# Patient Record
Sex: Female | Born: 1975 | Race: White | Hispanic: No | Marital: Single | Smoking: Former smoker
Health system: Southern US, Community
[De-identification: ages and names within clinical notes are randomized; demographics above are authoritative.]

## PROBLEM LIST (undated history)

## (undated) DIAGNOSIS — I10 Essential (primary) hypertension: Secondary | ICD-10-CM

## (undated) DIAGNOSIS — I219 Acute myocardial infarction, unspecified: Secondary | ICD-10-CM

## (undated) DIAGNOSIS — J309 Allergic rhinitis, unspecified: Secondary | ICD-10-CM

## (undated) HISTORY — DX: Acute myocardial infarction, unspecified: I21.9

## (undated) HISTORY — DX: Allergic rhinitis, unspecified: J30.9

## (undated) HISTORY — PX: OTHER SURGICAL HISTORY: SHX169

---

## 1998-07-22 ENCOUNTER — Encounter: Payer: Self-pay | Admitting: Emergency Medicine

## 1998-07-22 ENCOUNTER — Emergency Department (HOSPITAL_COMMUNITY): Admission: EM | Admit: 1998-07-22 | Discharge: 1998-07-22 | Payer: Self-pay | Admitting: Emergency Medicine

## 2001-01-04 ENCOUNTER — Other Ambulatory Visit: Admission: RE | Admit: 2001-01-04 | Discharge: 2001-01-04 | Payer: Self-pay | Admitting: Family Medicine

## 2001-12-25 ENCOUNTER — Other Ambulatory Visit: Admission: RE | Admit: 2001-12-25 | Discharge: 2001-12-25 | Payer: Self-pay | Admitting: Family Medicine

## 2003-01-03 ENCOUNTER — Other Ambulatory Visit: Admission: RE | Admit: 2003-01-03 | Discharge: 2003-01-03 | Payer: Self-pay | Admitting: Gynecology

## 2005-01-05 ENCOUNTER — Other Ambulatory Visit: Admission: RE | Admit: 2005-01-05 | Discharge: 2005-01-05 | Payer: Self-pay | Admitting: Gynecology

## 2005-05-17 ENCOUNTER — Other Ambulatory Visit: Admission: RE | Admit: 2005-05-17 | Discharge: 2005-05-17 | Payer: Self-pay | Admitting: Gynecology

## 2005-11-16 ENCOUNTER — Other Ambulatory Visit: Admission: RE | Admit: 2005-11-16 | Discharge: 2005-11-16 | Payer: Self-pay | Admitting: Gynecology

## 2006-02-09 ENCOUNTER — Other Ambulatory Visit: Admission: RE | Admit: 2006-02-09 | Discharge: 2006-02-09 | Payer: Self-pay | Admitting: Gynecology

## 2006-02-13 ENCOUNTER — Encounter: Admission: RE | Admit: 2006-02-13 | Discharge: 2006-02-13 | Payer: Self-pay | Admitting: Gynecology

## 2007-05-09 ENCOUNTER — Inpatient Hospital Stay: Payer: Self-pay | Admitting: Unknown Physician Specialty

## 2012-10-11 ENCOUNTER — Ambulatory Visit: Payer: Self-pay | Admitting: Family Medicine

## 2015-03-16 ENCOUNTER — Encounter: Payer: Self-pay | Admitting: Emergency Medicine

## 2015-03-16 DIAGNOSIS — I214 Non-ST elevation (NSTEMI) myocardial infarction: Principal | ICD-10-CM | POA: Diagnosis present

## 2015-03-16 DIAGNOSIS — I10 Essential (primary) hypertension: Secondary | ICD-10-CM | POA: Diagnosis present

## 2015-03-16 DIAGNOSIS — F329 Major depressive disorder, single episode, unspecified: Secondary | ICD-10-CM | POA: Diagnosis present

## 2015-03-16 DIAGNOSIS — Z8249 Family history of ischemic heart disease and other diseases of the circulatory system: Secondary | ICD-10-CM

## 2015-03-16 DIAGNOSIS — Z6841 Body Mass Index (BMI) 40.0 and over, adult: Secondary | ICD-10-CM

## 2015-03-16 DIAGNOSIS — Z79899 Other long term (current) drug therapy: Secondary | ICD-10-CM

## 2015-03-16 NOTE — ED Notes (Signed)
Patient ambulatory to triage with steady gait, without difficulty or distress noted; pt reports began new job 2wks ago "it's really physical and I'm really sore in my chest and arms but it's not going away"; describes as squeezing to chest and arms bilat intermittently for last few days with no accomp symptoms; denies hx of same

## 2015-03-17 ENCOUNTER — Inpatient Hospital Stay
Admission: EM | Admit: 2015-03-17 | Discharge: 2015-03-19 | DRG: 281 | Disposition: A | Discharge status: Home / Self Care | Payer: BC Managed Care – PPO | Attending: Specialist | Admitting: Specialist

## 2015-03-17 ENCOUNTER — Encounter
Admission: EM | Disposition: A | Discharge status: Home / Self Care | Payer: Self-pay | Source: Home / Self Care | Attending: Specialist

## 2015-03-17 ENCOUNTER — Encounter: Payer: Self-pay | Admitting: Internal Medicine

## 2015-03-17 ENCOUNTER — Emergency Department: Payer: BC Managed Care – PPO

## 2015-03-17 ENCOUNTER — Inpatient Hospital Stay
Admit: 2015-03-17 | Discharge: 2015-03-17 | Disposition: A | Discharge status: Home / Self Care | Payer: BC Managed Care – PPO | Attending: Internal Medicine | Admitting: Internal Medicine

## 2015-03-17 DIAGNOSIS — I10 Essential (primary) hypertension: Secondary | ICD-10-CM | POA: Diagnosis present

## 2015-03-17 DIAGNOSIS — Z79899 Other long term (current) drug therapy: Secondary | ICD-10-CM | POA: Diagnosis not present

## 2015-03-17 DIAGNOSIS — I209 Angina pectoris, unspecified: Secondary | ICD-10-CM

## 2015-03-17 DIAGNOSIS — R079 Chest pain, unspecified: Secondary | ICD-10-CM | POA: Diagnosis present

## 2015-03-17 DIAGNOSIS — I214 Non-ST elevation (NSTEMI) myocardial infarction: Secondary | ICD-10-CM | POA: Diagnosis present

## 2015-03-17 DIAGNOSIS — F329 Major depressive disorder, single episode, unspecified: Secondary | ICD-10-CM | POA: Diagnosis present

## 2015-03-17 DIAGNOSIS — Z8249 Family history of ischemic heart disease and other diseases of the circulatory system: Secondary | ICD-10-CM | POA: Diagnosis not present

## 2015-03-17 DIAGNOSIS — Z6841 Body Mass Index (BMI) 40.0 and over, adult: Secondary | ICD-10-CM | POA: Diagnosis not present

## 2015-03-17 HISTORY — DX: Essential (primary) hypertension: I10

## 2015-03-17 HISTORY — PX: CARDIAC CATHETERIZATION: SHX172

## 2015-03-17 LAB — LIPID PANEL
Cholesterol: 261 mg/dL — ABNORMAL HIGH (ref 0–200)
HDL: 31 mg/dL — ABNORMAL LOW (ref >40)
LDL Cholesterol: 162 mg/dL — ABNORMAL HIGH (ref 0–99)
Total CHOL/HDL Ratio: 8.4 RATIO
Triglycerides: 339 mg/dL — ABNORMAL HIGH (ref <150)
VLDL: 68 mg/dL — ABNORMAL HIGH (ref 0–40)

## 2015-03-17 LAB — PROTIME-INR
INR: 0.97
Prothrombin Time: 13.1 seconds (ref 11.4–15.0)

## 2015-03-17 LAB — CBC
HEMATOCRIT: 40.5 % (ref 35.0–47.0)
Hemoglobin: 13.5 g/dL (ref 12.0–16.0)
MCH: 29.2 pg (ref 26.0–34.0)
MCHC: 33.3 g/dL (ref 32.0–36.0)
MCV: 87.7 fL (ref 80.0–100.0)
Platelets: 157 10*3/uL (ref 150–440)
RBC: 4.62 MIL/uL (ref 3.80–5.20)
RDW: 13.7 % (ref 11.5–14.5)
WBC: 11.2 10*3/uL — ABNORMAL HIGH (ref 3.6–11.0)

## 2015-03-17 LAB — URINE DRUG SCREEN, QUALITATIVE (ARMC ONLY)
Amphetamines, Ur Screen: NOT DETECTED
Barbiturates, Ur Screen: NOT DETECTED
Benzodiazepine, Ur Scrn: NOT DETECTED
CANNABINOID 50 NG, UR ~~LOC~~: NOT DETECTED
COCAINE METABOLITE, UR ~~LOC~~: NOT DETECTED
MDMA (ECSTASY) UR SCREEN: NOT DETECTED
METHADONE SCREEN, URINE: NOT DETECTED
Opiate, Ur Screen: NOT DETECTED
Phencyclidine (PCP) Ur S: NOT DETECTED
TRICYCLIC, UR SCREEN: NOT DETECTED

## 2015-03-17 LAB — APTT: APTT: 41 s — AB (ref 24–36)

## 2015-03-17 LAB — BASIC METABOLIC PANEL
Anion gap: 5 (ref 5–15)
BUN: 14 mg/dL (ref 6–20)
CO2: 26 mmol/L (ref 22–32)
CREATININE: 0.87 mg/dL (ref 0.44–1.00)
Calcium: 8.7 mg/dL — ABNORMAL LOW (ref 8.9–10.3)
Chloride: 107 mmol/L (ref 101–111)
GFR calc Af Amer: >60 mL/min (ref >60)
GLUCOSE: 116 mg/dL — AB (ref 65–99)
POTASSIUM: 3.5 mmol/L (ref 3.5–5.1)
Sodium: 138 mmol/L (ref 135–145)

## 2015-03-17 LAB — TROPONIN I
Troponin I: 1.17 ng/mL — ABNORMAL HIGH (ref <0.031)
Troponin I: 1.26 ng/mL — ABNORMAL HIGH (ref <0.031)
Troponin I: 1.79 ng/mL — ABNORMAL HIGH (ref <0.031)

## 2015-03-17 LAB — TSH: TSH: 6.05 u[IU]/mL — ABNORMAL HIGH (ref 0.350–4.500)

## 2015-03-17 LAB — HEPARIN LEVEL (UNFRACTIONATED): HEPARIN UNFRACTIONATED: 0.1 [IU]/mL — AB (ref 0.30–0.70)

## 2015-03-17 LAB — HEMOGLOBIN A1C: Hgb A1c MFr Bld: 5.3 % (ref 4.0–6.0)

## 2015-03-17 SURGERY — LEFT HEART CATH AND CORONARY ANGIOGRAPHY
Anesthesia: Moderate Sedation | Laterality: Right

## 2015-03-17 MED ORDER — ASPIRIN 81 MG PO CHEW
CHEWABLE_TABLET | ORAL | Status: AC
Start: 2015-03-17 — End: 2015-03-17
  Administered 2015-03-17: 12:00:00
  Filled 2015-03-17: qty 1

## 2015-03-17 MED ORDER — ASPIRIN 81 MG PO CHEW
324.0000 mg | CHEWABLE_TABLET | Freq: Once | ORAL | Status: AC
  Administered 2015-03-17: 324 mg via ORAL
  Filled 2015-03-17: qty 4

## 2015-03-17 MED ORDER — LABETALOL HCL 5 MG/ML IV SOLN
10.0000 mg | INTRAVENOUS | Status: DC | PRN
Start: 2015-03-17 — End: 2015-03-19
  Administered 2015-03-17: 10 mg via INTRAVENOUS

## 2015-03-17 MED ORDER — IOHEXOL 300 MG/ML  SOLN
INTRAMUSCULAR | Status: DC | PRN
  Administered 2015-03-17: 95 mL via INTRA_ARTERIAL
  Administered 2015-03-17: 30 mL via INTRA_ARTERIAL

## 2015-03-17 MED ORDER — SODIUM CHLORIDE 0.9 % WEIGHT BASED INFUSION: 1.0000 mL/kg/h | INTRAVENOUS | Status: DC

## 2015-03-17 MED ORDER — SODIUM CHLORIDE 0.9 % IJ SOLN
3.0000 mL | INTRAMUSCULAR | Status: DC | PRN
Start: 2015-03-17 — End: 2015-03-18

## 2015-03-17 MED ORDER — ASPIRIN 81 MG PO CHEW
81.0000 mg | CHEWABLE_TABLET | ORAL | Status: AC
  Administered 2015-03-18: 81 mg via ORAL
  Filled 2015-03-17: qty 1

## 2015-03-17 MED ORDER — SODIUM CHLORIDE 0.9 % WEIGHT BASED INFUSION: 3.0000 mL/kg/h | INTRAVENOUS | Status: DC

## 2015-03-17 MED ORDER — SODIUM CHLORIDE 0.9 % IV SOLN: 250.0000 mL | INTRAVENOUS | Status: DC | PRN

## 2015-03-17 MED ORDER — DOCUSATE SODIUM 100 MG PO CAPS
100.0000 mg | ORAL_CAPSULE | Freq: Two times a day (BID) | ORAL | Status: DC
  Administered 2015-03-19: 100 mg via ORAL
  Filled 2015-03-17 (×4): qty 1

## 2015-03-17 MED ORDER — FENTANYL CITRATE (PF) 100 MCG/2ML IJ SOLN
INTRAMUSCULAR | Status: AC
  Filled 2015-03-17: qty 2

## 2015-03-17 MED ORDER — SODIUM CHLORIDE 0.9 % IJ SOLN
3.0000 mL | Freq: Two times a day (BID) | INTRAMUSCULAR | Status: DC
  Administered 2015-03-17: 3 mL via INTRAVENOUS

## 2015-03-17 MED ORDER — MORPHINE SULFATE (PF) 2 MG/ML IV SOLN: 2.0000 mg | INTRAVENOUS | Status: DC | PRN

## 2015-03-17 MED ORDER — HEPARIN (PORCINE) IN NACL 100-0.45 UNIT/ML-% IJ SOLN
1500.0000 [IU]/h | INTRAMUSCULAR | Status: DC
  Administered 2015-03-17 (×2): 1200 [IU]/h via INTRAVENOUS
  Filled 2015-03-17 (×2): qty 250

## 2015-03-17 MED ORDER — ACETAMINOPHEN 325 MG PO TABS
650.0000 mg | ORAL_TABLET | ORAL | Status: DC | PRN
  Filled 2015-03-17 (×2): qty 2

## 2015-03-17 MED ORDER — SODIUM CHLORIDE 0.9 % IJ SOLN: 3.0000 mL | INTRAMUSCULAR | Status: DC | PRN

## 2015-03-17 MED ORDER — TIROFIBAN (AGGRASTAT) BOLUS VIA INFUSION
25.0000 ug/kg | Freq: Once | INTRAVENOUS | Status: AC
  Administered 2015-03-17: 3025 ug via INTRAVENOUS
  Filled 2015-03-17: qty 61

## 2015-03-17 MED ORDER — SODIUM CHLORIDE 0.9 % IJ SOLN
3.0000 mL | Freq: Two times a day (BID) | INTRAMUSCULAR | Status: DC
  Administered 2015-03-17 – 2015-03-18 (×2): 3 mL via INTRAVENOUS

## 2015-03-17 MED ORDER — ONDANSETRON HCL 4 MG/2ML IJ SOLN
INTRAMUSCULAR | Status: AC
  Administered 2015-03-17: 4 mg via INTRAVENOUS
  Filled 2015-03-17: qty 2

## 2015-03-17 MED ORDER — ACETAMINOPHEN 650 MG RE SUPP: 650.0000 mg | Freq: Four times a day (QID) | RECTAL | Status: DC | PRN

## 2015-03-17 MED ORDER — TIROFIBAN HCL IV 5 MG/100ML: 0.1500 ug/kg/min | INTRAVENOUS | Status: DC

## 2015-03-17 MED ORDER — ASPIRIN 81 MG PO CHEW: 81.0000 mg | CHEWABLE_TABLET | ORAL | Status: DC

## 2015-03-17 MED ORDER — TIROFIBAN HCL IV 5 MG/100ML
0.1500 ug/kg/min | INTRAVENOUS | Status: AC
  Administered 2015-03-17 – 2015-03-18 (×5): 0.15 ug/kg/min via INTRAVENOUS
  Filled 2015-03-17 (×8): qty 100

## 2015-03-17 MED ORDER — NITROGLYCERIN 0.4 MG SL SUBL
0.4000 mg | SUBLINGUAL_TABLET | SUBLINGUAL | Status: DC | PRN
  Administered 2015-03-17 (×4): 0.4 mg via SUBLINGUAL
  Filled 2015-03-17 (×4): qty 1

## 2015-03-17 MED ORDER — LABETALOL HCL 5 MG/ML IV SOLN
INTRAVENOUS | Status: AC
  Administered 2015-03-17: 10 mg via INTRAVENOUS
  Filled 2015-03-17: qty 4

## 2015-03-17 MED ORDER — HEPARIN SODIUM (PORCINE) 5000 UNIT/ML IJ SOLN: 5000.0000 [IU] | Freq: Three times a day (TID) | INTRAMUSCULAR | Status: DC

## 2015-03-17 MED ORDER — FENTANYL CITRATE (PF) 100 MCG/2ML IJ SOLN
INTRAMUSCULAR | Status: DC | PRN
  Administered 2015-03-17 (×4): 50 ug via INTRAVENOUS

## 2015-03-17 MED ORDER — MIDAZOLAM HCL 2 MG/2ML IJ SOLN
INTRAMUSCULAR | Status: DC | PRN
  Administered 2015-03-17 (×2): 1 mg via INTRAVENOUS

## 2015-03-17 MED ORDER — CLOPIDOGREL BISULFATE 75 MG PO TABS
300.0000 mg | ORAL_TABLET | ORAL | Status: AC
  Administered 2015-03-18: 300 mg via ORAL
  Filled 2015-03-17: qty 4

## 2015-03-17 MED ORDER — SODIUM CHLORIDE 0.9 % WEIGHT BASED INFUSION: 1.0000 mL/kg/h | INTRAVENOUS | Status: AC

## 2015-03-17 MED ORDER — MIDAZOLAM HCL 2 MG/2ML IJ SOLN
INTRAMUSCULAR | Status: AC
  Filled 2015-03-17: qty 2

## 2015-03-17 MED ORDER — HEPARIN BOLUS VIA INFUSION
2600.0000 [IU] | INTRAVENOUS | Status: AC
  Administered 2015-03-17: 2600 [IU] via INTRAVENOUS
  Filled 2015-03-17: qty 2600

## 2015-03-17 MED ORDER — SERTRALINE HCL 50 MG PO TABS
50.0000 mg | ORAL_TABLET | Freq: Every day | ORAL | Status: DC
  Administered 2015-03-17 – 2015-03-19 (×3): 50 mg via ORAL
  Filled 2015-03-17 (×3): qty 1

## 2015-03-17 MED ORDER — ACETAMINOPHEN 325 MG PO TABS
650.0000 mg | ORAL_TABLET | Freq: Four times a day (QID) | ORAL | Status: DC | PRN
  Administered 2015-03-17 – 2015-03-18 (×3): 650 mg via ORAL
  Filled 2015-03-17 (×3): qty 2

## 2015-03-17 MED ORDER — ATORVASTATIN CALCIUM 20 MG PO TABS
40.0000 mg | ORAL_TABLET | Freq: Every day | ORAL | Status: DC
  Administered 2015-03-17 – 2015-03-18 (×2): 40 mg via ORAL
  Filled 2015-03-17 (×2): qty 2

## 2015-03-17 MED ORDER — SODIUM CHLORIDE 0.9 % IV SOLN
INTRAVENOUS | Status: DC
  Administered 2015-03-18: 05:00:00 via INTRAVENOUS

## 2015-03-17 MED ORDER — ONDANSETRON HCL 4 MG/2ML IJ SOLN: 4.0000 mg | Freq: Four times a day (QID) | INTRAMUSCULAR | Status: DC | PRN

## 2015-03-17 MED ORDER — ONDANSETRON HCL 4 MG PO TABS: 4.0000 mg | ORAL_TABLET | Freq: Four times a day (QID) | ORAL | Status: DC | PRN

## 2015-03-17 MED ORDER — HEPARIN (PORCINE) IN NACL 2-0.9 UNIT/ML-% IJ SOLN
INTRAMUSCULAR | Status: AC
Start: 2015-03-17 — End: 2015-03-17
  Filled 2015-03-17: qty 1000

## 2015-03-17 MED ORDER — ONDANSETRON HCL 4 MG/2ML IJ SOLN
4.0000 mg | Freq: Four times a day (QID) | INTRAMUSCULAR | Status: DC | PRN
  Administered 2015-03-17 (×3): 4 mg via INTRAVENOUS
  Filled 2015-03-17 (×2): qty 2

## 2015-03-17 MED ORDER — ACETAMINOPHEN 325 MG PO TABS
650.0000 mg | ORAL_TABLET | Freq: Once | ORAL | Status: AC
  Administered 2015-03-17: 650 mg via ORAL
  Filled 2015-03-17: qty 2

## 2015-03-17 MED ORDER — METOPROLOL TARTRATE 25 MG PO TABS
25.0000 mg | ORAL_TABLET | Freq: Two times a day (BID) | ORAL | Status: DC
  Administered 2015-03-17 – 2015-03-19 (×5): 25 mg via ORAL
  Filled 2015-03-17 (×5): qty 1

## 2015-03-17 MED ORDER — HEPARIN BOLUS VIA INFUSION
4000.0000 [IU] | Freq: Once | INTRAVENOUS | Status: AC
  Administered 2015-03-17: 4000 [IU] via INTRAVENOUS
  Filled 2015-03-17: qty 4000

## 2015-03-17 SURGICAL SUPPLY — 9 items
CATH INFINITI 5FR ANG PIGTAIL (CATHETERS) ×3 IMPLANT
CATH INFINITI 5FR JL4 (CATHETERS) ×3 IMPLANT
CATH INFINITI JR4 5F (CATHETERS) ×3 IMPLANT
KIT MANI 3VAL PERCEP (MISCELLANEOUS) ×3 IMPLANT
NDL PERC 18GX7CM (NEEDLE) ×1 IMPLANT
NEEDLE PERC 18GX7CM (NEEDLE) ×3 IMPLANT
PACK CARDIAC CATH (CUSTOM PROCEDURE TRAY) ×3 IMPLANT
SHEATH PINNACLE 5F 10CM (SHEATH) ×3 IMPLANT
WIRE EMERALD 3MM-J .035X150CM (WIRE) ×3 IMPLANT

## 2015-03-17 NOTE — Progress Notes (Signed)
Patient admitted with chest pain,troponin positive x2,heparin drip infusing,cardiology consulted,hypertensive.

## 2015-03-17 NOTE — Progress Notes (Signed)
Report to [facility]--telemetry.  Check right groin for bleeding or hematoma.  Patient will be on bedrest for 3 hours post sheath pull---out of bed at 16:30.  Bilateral pulses are 2's DP's.Marland Kitchen

## 2015-03-17 NOTE — Progress Notes (Signed)
Occluded  Mid LAD probably for few days and completed MI, she still has normal EF, with WMA anteroseptally. Will get aggrastat tonight and PCI tomorrow  By [facility].

## 2015-03-17 NOTE — Progress Notes (Signed)
*  PRELIMINARY RESULTS* Echocardiogram 2D Echocardiogram has been performed.  Breanna Rosario Hege 03/17/2015, 10:36 AM

## 2015-03-17 NOTE — Consult Note (Signed)
Primary Cardiologist: None   Reason for Consultation : NSTEMI  HPI :This is a 39yo pleasant, white female who presented to ER last night c/o CP since last Thursday. She describes CP as substernal, pressure-type, occuring with exertion, with radiation to b/l arms, no concurrent SOB, diaphoresis or nausea. She states CP has been relieved with SL nitro.         Review of Systems: General: negative for chills, fever, night sweats or weight changes.  Cardiovascular: positive for chest pain, negative for edema, orthopnea, palpitations, paroxysmal nocturnal dyspnea, shortness of breath or dyspnea on exertion Dermatological: negative for rash Respiratory: negative for cough or wheezing Urologic: negative for hematuria Abdominal: negative for nausea, vomiting, diarrhea, bright red blood per rectum, melena, or hematemesis Neurologic: negative for visual changes, syncope, or dizziness All other systems reviewed and are otherwise negative except as noted above.    Past Medical History  Diagnosis Date  . Hypertension     Medications Prior to Admission  Medication Sig Dispense Refill  . sertraline (ZOLOFT) 50 MG tablet Take 1 tablet by mouth daily.  3     . [START ON 03/18/2015] aspirin  81 mg Oral Pre-Cath  . atorvastatin  40 mg Oral q1800  . docusate sodium  100 mg Oral BID  . metoprolol tartrate  25 mg Oral BID  . sertraline  50 mg Oral Daily  . sodium chloride  3 mL Intravenous Q12H  . sodium chloride  3 mL Intravenous Q12H    Infusions: . [START ON 03/18/2015] sodium chloride     Followed by  . [START ON 03/18/2015] sodium chloride    . heparin 1,500 Units/hr (03/17/15 1056)    No Known Allergies  Social History   Social History  . Marital Status: Single    Spouse Name: N/A  . Number of Children: N/A  . Years of Education: N/A   Occupational History  . Not on file.   Social History Main Topics  . Smoking status: Never Smoker   . Smokeless tobacco: Not on file   . Alcohol Use: No  . Drug Use: Not on file  . Sexual Activity: Not on file   Other Topics Concern  . Not on file   Social History Narrative    Family History  Problem Relation Age of Onset  . Coronary artery disease Father     Died at age 22; four-vessel bypass at 39 years of age    PHYSICAL EXAM: Filed Vitals:   03/17/15 1130  BP: 150/89  Pulse: 78  Temp: 98.7 F (37.1 C)  Resp: 16     Intake/Output Summary (Last 24 hours) at 03/17/15 1136 Last data filed at 03/17/15 0956  Gross per 24 hour  Intake      0 ml  Output    450 ml  Net   -450 ml    General:  Well appearing. No respiratory difficulty HEENT: normal Neck: supple. no JVD. Carotids 2+ bilat; no bruits. No lymphadenopathy or thryomegaly appreciated. Cor: PMI nondisplaced. Regular rate & rhythm. No rubs, gallops or murmurs. Lungs: clear Abdomen: soft, nontender, nondistended. No hepatosplenomegaly. No bruits or masses. Good bowel sounds. Extremities: no cyanosis, clubbing, rash, edema Neuro: alert & oriented x 3, cranial nerves grossly intact. moves all 4 extremities w/o difficulty. Affect pleasant.  ECG: NSR 88 BPM NS ST/T changes  Results for orders placed or performed during the hospital encounter of 03/17/15 (from the past 24 hour(s))  Basic metabolic panel  Status: Abnormal   Collection Time: 03/16/15 11:58 PM  Result Value Ref Range   Sodium 138 135 - 145 mmol/L   Potassium 3.5 3.5 - 5.1 mmol/L   Chloride 107 101 - 111 mmol/L   CO2 26 22 - 32 mmol/L   Glucose, Bld 116 (H) 65 - 99 mg/dL   BUN 14 6 - 20 mg/dL   Creatinine, Ser 4.40 0.44 - 1.00 mg/dL   Calcium 8.7 (L) 8.9 - 10.3 mg/dL   GFR calc non Af Amer >60 >60 mL/min   GFR calc Af Amer >60 >60 mL/min   Anion gap 5 5 - 15  CBC     Status: Abnormal   Collection Time: 03/16/15 11:58 PM  Result Value Ref Range   WBC 11.2 (H) 3.6 - 11.0 K/uL   RBC 4.62 3.80 - 5.20 MIL/uL   Hemoglobin 13.5 12.0 - 16.0 g/dL   HCT 10.2 72.5 - 36.6 %   MCV  87.7 80.0 - 100.0 fL   MCH 29.2 26.0 - 34.0 pg   MCHC 33.3 32.0 - 36.0 g/dL   RDW 44.0 34.7 - 42.5 %   Platelets 157 150 - 440 K/uL  Troponin I     Status: Abnormal   Collection Time: 03/16/15 11:58 PM  Result Value Ref Range   Troponin I 1.26 (H) <0.031 ng/mL  TSH     Status: Abnormal   Collection Time: 03/16/15 11:58 PM  Result Value Ref Range   TSH 6.050 (H) 0.350 - 4.500 uIU/mL  Protime-INR     Status: None   Collection Time: 03/16/15 11:58 PM  Result Value Ref Range   Prothrombin Time 13.1 11.4 - 15.0 seconds   INR 0.97   APTT     Status: Abnormal   Collection Time: 03/16/15 11:58 PM  Result Value Ref Range   aPTT 41 (H) 24 - 36 seconds  Troponin I     Status: Abnormal   Collection Time: 03/17/15  4:29 AM  Result Value Ref Range   Troponin I 1.79 (H) <0.031 ng/mL  Heparin level (unfractionated)     Status: Abnormal   Collection Time: 03/17/15  9:03 AM  Result Value Ref Range   Heparin Unfractionated 0.10 (L) 0.30 - 0.70 IU/mL  Lipid panel     Status: Abnormal   Collection Time: 03/17/15  9:03 AM  Result Value Ref Range   Cholesterol 261 (H) 0 - 200 mg/dL   Triglycerides 956 (H) <150 mg/dL   HDL 31 (L) >38 mg/dL   Total CHOL/HDL Ratio 8.4 RATIO   VLDL 68 (H) 0 - 40 mg/dL   LDL Cholesterol 756 (H) 0 - 99 mg/dL   Dg Chest 2 View  4/33/2951   CLINICAL DATA:  Chest pain  EXAM: CHEST  2 VIEW  COMPARISON:  10/11/2012  FINDINGS: Normal heart size and mediastinal contours. No acute infiltrate or edema. No effusion or pneumothorax. No acute osseous findings.  IMPRESSION: Negative chest.   Electronically Signed   By: Marnee Spring M.D.   On: 03/17/2015 00:33     ASSESSMENT: NSTEMI   PLAN/DISCUSSION: Plan for cardiac cath today at 12:30pm. Plan discussed with pt and pts parents who are at bedside, they state understanding and agreement.    Patient and plan discussed with supervising provider, Dr. Adrian Blackwater, who agrees with above findings.   Alinda Sierras Margarito Courser [facility] 03/17/2015 11:36 AM

## 2015-03-17 NOTE — ED Notes (Signed)
Patient states pain eased off after first nitro but has returned. Another SL given

## 2015-03-17 NOTE — Progress Notes (Signed)
Patient ambulated to bathroom tolerated well. Dressing to right groin dry and intact, site soft to touch. Will continue to monitor closely [employer]

## 2015-03-17 NOTE — Progress Notes (Signed)
Received report from [facility]. Per RN heparin drip is stopped at this time. I was instructed to call [facility] around 4 o'clock and clarify if heparin needs to be stopped and possibly starting patient on Aggrastat. When I asked if the Dr. Welton Flakes has placed order to discontinue heparin, I was told no that's what I need to clarify with Dr. Juliann Pares.  Will continue to monitor closely once patient is back on floor [employer]

## 2015-03-17 NOTE — ED Provider Notes (Signed)
[facility] Emergency Department Provider Note  ____________________________________________  Time seen: Approximately 1:16 AM  I have reviewed the triage vital signs and the nursing notes.   HISTORY  Chief Complaint Chest Pain    HPI Breanna Rosario is a 39 y.o. female who comes in today with chest pain. She reports that the pain started around Thursday or Friday. She reports that she recently started a new job and is very physical and thought that the pain was coming from her increased physical activity. The patient reports that she was sore and the pain would come and go. She reports the night between 9 and 9:30 the pain came and stayed. She reports that she has been taking aspirin and ibuprofen for the pain at home. She has no increased pain with breathing and her pain currently is a 4-5 out of 10 in intensity. The patient denies any shortness of breath or dizziness but she has been sweating a lot recently. The patient denies any nausea or vomiting and has never had this pain before. The pain is in the middle and the left side of her chest.   Past medical history Hypertension  There are no active problems to display for this patient.   History reviewed. No pertinent past surgical history.  No current outpatient prescriptions on file.  Allergies Review of patient's allergies indicates no known allergies.  No family history on file.  Social History Social History  Substance Use Topics  . Smoking status: Never Smoker   . Smokeless tobacco: None  . Alcohol Use: No    Review of Systems Constitutional: Sweats with No fever/chills Eyes: No visual changes. ENT: No sore throat. Cardiovascular:  chest pain. Respiratory: Denies shortness of breath. Gastrointestinal: No abdominal pain.  No nausea, no vomiting.  No diarrhea.  No constipation. Genitourinary: Negative for dysuria. Musculoskeletal: Negative for back pain. Skin: Negative for  rash. Neurological: Negative for headaches, focal weakness or numbness.  10-point ROS otherwise negative.  ____________________________________________   PHYSICAL EXAM:  VITAL SIGNS: ED Triage Vitals  Enc Vitals Group     BP 03/16/15 2355 138/104 mmHg     Pulse Rate 03/16/15 2355 89     Resp 03/16/15 2355 20     Temp 03/16/15 2355 97.9 F (36.6 C)     Temp Source 03/16/15 2355 Oral     SpO2 03/17/15 0113 99 %     Weight 03/16/15 2355 270 lb (122.471 kg)     Height 03/16/15 2355  (1.651 m)     Head Cir --      Peak Flow --      Pain Score 03/16/15 2355 3     Pain Loc --      Pain Edu? --      Excl. in GC? --     Constitutional: Alert and oriented. Well appearing and in mild distress. Eyes: Conjunctivae are normal. PERRL. EOMI. Head: Atraumatic. Nose: No congestion/rhinnorhea. Mouth/Throat: Mucous membranes are moist.  Oropharynx non-erythematous. Cardiovascular: Normal rate, regular rhythm. Grossly normal heart sounds.  Good peripheral circulation. Respiratory: Normal respiratory effort.  No retractions. Lungs CTAB. Gastrointestinal: Soft and nontender. No distention. Positive bowel sounds Musculoskeletal: No lower extremity tenderness nor edema.   Neurologic:  Normal speech and language.  Skin:  Skin is warm, dry and intact.  Psychiatric: Mood and affect are normal.   ____________________________________________   LABS (all labs ordered are listed, but only abnormal results are displayed)  Labs Reviewed  BASIC METABOLIC  PANEL - Abnormal; Notable for the following:    Glucose, Bld 116 (*)    Calcium 8.7 (*)    All other components within normal limits  CBC - Abnormal; Notable for the following:    WBC 11.2 (*)    All other components within normal limits  TROPONIN I - Abnormal; Notable for the following:    Troponin I 1.26 (*)    All other components within normal limits   ____________________________________________  EKG  ED ECG REPORT I, [name], the attending physician, personally viewed and interpreted this ECG.   Date: 03/16/2015  EKG Time: 2353  Rate: 88  Rhythm: normal sinus rhythm  Axis: normal  Intervals:none  ST&T Change: Flipped T waves in lead 3  ____________________________________________  RADIOLOGY  Chest x-ray: Negative chest ____________________________________________   PROCEDURES  Procedure(s) performed: None  Critical Care performed: No  ____________________________________________   INITIAL IMPRESSION / ASSESSMENT AND PLAN / ED COURSE  Pertinent labs & imaging results that were available during my care of the patient were reviewed by me and considered in my medical decision making (see chart for details).  The patient is a 39 year old female who comes in with chest pain. The patient did have some blood work done and she has an elevated troponin of 1.26. I will give the patient a dose of aspirin, nitroglycerin and placed the patient on a heparin drip. I will admit the patient to the hospitalist service for further evaluation and treatment of her acute coronary syndrome. I discussed this with the patient and she has no further complaints or concerns. ____________________________________________   FINAL CLINICAL IMPRESSION(S) / ED DIAGNOSES  Final diagnoses:  Ischemic chest pain  NSTEMI (non-ST elevated myocardial infarction)      [name], MD 03/17/15 [phone]

## 2015-03-17 NOTE — ED Notes (Signed)
Attempted IV times 2. Unsuccessful  

## 2015-03-17 NOTE — Progress Notes (Signed)
Spoke with dr. Juliann Pares to discuss clarification orders. Per md discontinue heparin drip. Have pharmacy dose aggrastat and start at 1600. Okay to give patient 25mg  po lopressor once back on floor and start on heart healthy diet [facility] [address]

## 2015-03-17 NOTE — Progress Notes (Signed)
[facility] [place] [facility]                                                                                                                                                                                            Patient Demographics   Breanna Rosario, is a 39 y.o. female, DOB - August 31, 1975, ZOX:[id number]  Admit date - 03/17/2015   Admitting Physician Arnaldo Natal, MD  Outpatient Primary MD for the patient is No primary care provider on file.   LOS - 0  Subjective: Patient required 2 nitroglycerin overnight. Currently no chest pain or shortness of breath     Review of Systems:   CONSTITUTIONAL: No documented fever. No fatigue, weakness. No weight gain, no weight loss.  EYES: No blurry or double vision.  ENT: No tinnitus. No postnasal drip. No redness of the oropharynx.  RESPIRATORY: No cough, no wheeze, no hemoptysis. No dyspnea.  CARDIOVASCULAR: No chest pain. No orthopnea. No palpitations. No syncope.  GASTROINTESTINAL: No nausea, no vomiting or diarrhea. No abdominal pain. No melena or hematochezia.  GENITOURINARY: No dysuria or hematuria.  ENDOCRINE: No polyuria or nocturia. No heat or cold intolerance.  HEMATOLOGY: No anemia. No bruising. No bleeding.  INTEGUMENTARY: No rashes. No lesions.  MUSCULOSKELETAL: No arthritis. No swelling. No gout.  NEUROLOGIC: No numbness, tingling, or ataxia. No seizure-type activity.  PSYCHIATRIC: No anxiety. No insomnia. No ADD.    Vitals:   Filed Vitals:   03/17/15 0245 03/17/15 0300 03/17/15 0344 03/17/15 0724  BP:  174/116 153/97 159/85  Pulse: 85 78 79   Temp:   97.8 F (36.6 C)   TempSrc:   Oral   Resp: Height:      Weight:   121.02 kg (266 lb 12.8 oz)   SpO2: 99% 98% 98%     Wt Readings from Last 3 Encounters:  03/17/15 121.02 kg (266 lb 12.8 oz)     Intake/Output Summary (Last 24 hours) at 03/17/15 1130 Last data filed at 03/17/15 0956  Gross per 24 hour  Intake      0  ml  Output    450 ml  Net   -450 ml    Physical Exam:   GENERAL: Pleasant-appearing in no apparent distress.  HEAD, EYES, EARS, NOSE AND THROAT: Atraumatic, normocephalic. Extraocular muscles are intact. Pupils equal and reactive to light. Sclerae anicteric. No conjunctival injection. No oro-pharyngeal erythema.  NECK: Supple. There is no jugular venous distention. No bruits, no lymphadenopathy, no thyromegaly.  HEART: Regular rate and rhythm,. No murmurs, no rubs, no clicks.  LUNGS: Clear to  auscultation bilaterally. No rales or rhonchi. No wheezes.  ABDOMEN: Soft, flat, nontender, nondistended. Has good bowel sounds. No hepatosplenomegaly appreciated.  EXTREMITIES: No evidence of any cyanosis, clubbing, or peripheral edema.  +2 pedal and radial pulses bilaterally.  NEUROLOGIC: The patient is alert, awake, and oriented x3 with no focal motor or sensory deficits appreciated bilaterally.  SKIN: Moist and warm with no rashes appreciated.  Psych: Not anxious, depressed LN: No inguinal LN enlargement    Antibiotics   Anti-infectives    None      Medications   Scheduled Meds: . [START ON 03/18/2015] aspirin  81 mg Oral Pre-Cath  . atorvastatin  40 mg Oral q1800  . docusate sodium  100 mg Oral BID  . sertraline  50 mg Oral Daily  . sodium chloride  3 mL Intravenous Q12H  . sodium chloride  3 mL Intravenous Q12H   Continuous Infusions: . [START ON 03/18/2015] sodium chloride     Followed by  . [START ON 03/18/2015] sodium chloride    . heparin 1,500 Units/hr (03/17/15 1056)   PRN Meds:.sodium chloride, acetaminophen **OR** acetaminophen, labetalol, morphine injection, nitroGLYCERIN, ondansetron **OR** ondansetron (ZOFRAN) IV, sodium chloride   Data Review:   Micro Results No results found for this or any previous visit (from the past 240 hour(s)).  Radiology Reports Dg Chest 2 View  03/17/2015   CLINICAL DATA:  Chest pain  EXAM: CHEST  2 VIEW  COMPARISON:  10/11/2012   FINDINGS: Normal heart size and mediastinal contours. No acute infiltrate or edema. No effusion or pneumothorax. No acute osseous findings.  IMPRESSION: Negative chest.   Electronically Signed   By: Marnee Spring M.D.   On: 03/17/2015 00:33     CBC  Recent Labs Lab 03/16/15 2358  WBC 11.2*  HGB 13.5  HCT 40.5  PLT 157  MCV 87.7  MCH 29.2  MCHC 33.3  RDW 13.7    Chemistries   Recent Labs Lab 03/16/15 2358  NA 138  K 3.5  CL 107  CO2 26  GLUCOSE 116*  BUN 14  CREATININE 0.87  CALCIUM 8.7*   ------------------------------------------------------------------------------------------------------------------ estimated creatinine clearance is 113.2 mL/min (by C-G formula based on Cr of 0.87). ------------------------------------------------------------------------------------------------------------------ No results for input(s): HGBA1C in the last 72 hours. ------------------------------------------------------------------------------------------------------------------  Recent Labs  03/17/15 0903  CHOL 261*  HDL 31*  LDLCALC 162*  TRIG 339*  CHOLHDL 8.4   ------------------------------------------------------------------------------------------------------------------  Recent Labs  03/16/15 2358  TSH 6.050*   ------------------------------------------------------------------------------------------------------------------ No results for input(s): VITAMINB12, FOLATE, FERRITIN, TIBC, IRON, RETICCTPCT in the last 72 hours.  Coagulation profile  Recent Labs Lab 03/16/15 2358  INR 0.97    No results for input(s): DDIMER in the last 72 hours.  Cardiac Enzymes  Recent Labs Lab 03/16/15 2358 03/17/15 0429  TROPONINI 1.26* 1.79*   ------------------------------------------------------------------------------------------------------------------ Invalid input(s): POCBNP    Assessment & Plan   This is a 39 year old Caucasian female admitted for chest  pain. 1. Acute Non-ST MI continue heparin drip, aspirin, I will add Lipitor to her current treatment I have discussed the case with Dr. Lennette Bihari will likely do a cardiac catheter later today 2. Essential hypertension:  Start patient on metoprolol additional medications may needed 3. Depression: Continue Zoloft 4. Morbid obesity: BMI is 45; encourage healthy diet and exercise 5. DVT prophylaxis: Heparin 6. GI prophylaxis: None       Code Status Orders        Start     Ordered   03/17/15 0401  Full code   Continuous     03/17/15 0400           Consults  heart etiology   DVT Prophylaxis  heparin  Lab Results  Component Value Date   PLT 157 03/16/2015     Time Spent in minutes  45 minutes Auburn Bilberry M.D on 03/17/2015 at 11:30 AM  Between 7am to 6pm - Pager - [phone]  After 6pm go to [email or web address] - password [email or web address] [facility]  [facility]   Office  [phone]

## 2015-03-17 NOTE — Progress Notes (Signed)
ANTICOAGULATION CONSULT NOTE - Initial Consult  Pharmacy Consult for heparin Indication: chest pain/ACS  No Known Allergies  Patient Measurements: Height: 5\' 5"  (165.1 cm) Weight: 270 lb (122.471 kg) IBW/kg (Calculated) : 57 Heparin Dosing Weight: 86.6 kg  Vital Signs: Temp: 97.9 F (36.6 C) (09/26 2355) Temp Source: Oral (09/26 2355) BP: 161/105 mmHg (09/27 0113) Pulse Rate: 88 (09/27 0113)  Labs:  Recent Labs  03/16/15 2358  HGB 13.5  HCT 40.5  PLT 157  CREATININE 0.87  TROPONINI 1.26*    Estimated Creatinine Clearance: 114 mL/min (by C-G formula based on Cr of 0.87).   Medical History: History reviewed. No pertinent past medical history.  Medications:  Infusions:  . heparin      Assessment: 39 yof cc sore in chest and arms not going away, squeezing bilat intermittent. Troponin in ED 1.26 x 1, starting UFH.   Goal of Therapy:  Heparin level 0.3-0.7 units/ml Monitor platelets by anticoagulation protocol: Yes   Plan:  Give 4000 units bolus x 1 Start heparin infusion at 1200 units/hr Check anti-Xa level in 6 hours and daily while on heparin Continue to monitor H&H and platelets  Carola Frost, Pharm.D.  Clinical Pharmacist 03/17/2015,1:56 AM

## 2015-03-17 NOTE — H&P (Signed)
Breanna Rosario is an 39 y.o. female.   Chief Complaint: Chest pain HPI: The patient presents emergency department complaining of chest pain that began 4 days ago. She states that he had been intermittent at first but became constant and more severe this afternoon as she was walking a short distance. The pain is to the left of her sternum and slightly radiates under her left breast. She denies associated nausea, shortness of breath or diaphoresis. The pain was initially 10 out of 10 in severity. After nitroglycerin in the emergency department she states the pain decreased to out of 10 in severity. Getting out of bed to use the restroom while in the emergency department increased her pain that promptly was relieved with nitroglycerin once again. The patient's family history is significant for her father undergoing quadruple bypass at 81 years of age. Due to her family history and other risk factors the emergency department staff called for admission.  Past Medical History  Diagnosis Date  . Hypertension     Past Surgical History  Procedure Laterality Date  . None      Family History  Problem Relation Age of Onset  . Coronary artery disease Father     Died at age 68; four-vessel bypass at 39 years of age   Social History:  reports that she has never smoked. She does not have any smokeless tobacco history on file. She reports that she does not drink alcohol. Her drug history is not on file.  Allergies: No Known Allergies  Medications Prior to Admission  Medication Sig Dispense Refill  . sertraline (ZOLOFT) 50 MG tablet Take 1 tablet by mouth daily.  3    Results for orders placed or performed during the hospital encounter of 03/17/15 (from the past 48 hour(s))  Basic metabolic panel     Status: Abnormal   Collection Time: 03/16/15 11:58 PM  Result Value Ref Range   Sodium 138 135 - 145 mmol/L   Potassium 3.5 3.5 - 5.1 mmol/L   Chloride 107 101 - 111 mmol/L   CO2 26 22 - 32 mmol/L    Glucose, Bld 116 (H) 65 - 99 mg/dL   BUN 14 6 - 20 mg/dL   Creatinine, Ser 0.87 0.44 - 1.00 mg/dL   Calcium 8.7 (L) 8.9 - 10.3 mg/dL   GFR calc non Af Amer >60 >60 mL/min   GFR calc Af Amer >60 >60 mL/min    Comment: (NOTE) The eGFR has been calculated using the CKD EPI equation. This calculation has not been validated in all clinical situations. eGFR's persistently <60 mL/min signify possible Chronic Kidney Disease.    Anion gap 5 5 - 15  CBC     Status: Abnormal   Collection Time: 03/16/15 11:58 PM  Result Value Ref Range   WBC 11.2 (H) 3.6 - 11.0 K/uL   RBC 4.62 3.80 - 5.20 MIL/uL   Hemoglobin 13.5 12.0 - 16.0 g/dL   HCT 40.5 35.0 - 47.0 %   MCV 87.7 80.0 - 100.0 fL   MCH 29.2 26.0 - 34.0 pg   MCHC 33.3 32.0 - 36.0 g/dL   RDW 13.7 11.5 - 14.5 %   Platelets 157 150 - 440 K/uL  Troponin I     Status: Abnormal   Collection Time: 03/16/15 11:58 PM  Result Value Ref Range   Troponin I 1.26 (H) <0.031 ng/mL    Comment: READ BACK AND VERIFIED RACHEL DAVID 03/17/2015 0043 [facility]  POSSIBLE MYOCARDIAL ISCHEMIA. SERIAL TESTING RECOMMENDED.   TSH     Status: Abnormal   Collection Time: 03/16/15 11:58 PM  Result Value Ref Range   TSH 6.050 (H) 0.350 - 4.500 uIU/mL  Protime-INR     Status: None   Collection Time: 03/16/15 11:58 PM  Result Value Ref Range   Prothrombin Time 13.1 11.4 - 15.0 seconds   INR 0.97   APTT     Status: Abnormal   Collection Time: 03/16/15 11:58 PM  Result Value Ref Range   aPTT 41 (H) 24 - 36 seconds  Troponin I     Status: Abnormal   Collection Time: 03/17/15  4:29 AM  Result Value Ref Range   Troponin I 1.79 (H) <0.031 ng/mL    Comment: READ BACK AND VERIFIED LANC LANSANA 03/17/2015 0508 [facility]        POSSIBLE MYOCARDIAL ISCHEMIA. SERIAL TESTING RECOMMENDED.    Dg Chest 2 View  03/17/2015   CLINICAL DATA:  Chest pain  EXAM: CHEST  2 VIEW  COMPARISON:  10/11/2012  FINDINGS: Normal heart size and mediastinal contours. No acute infiltrate or  edema. No effusion or pneumothorax. No acute osseous findings.  IMPRESSION: Negative chest.   Electronically Signed   By: Monte Fantasia M.D.   On: 03/17/2015 00:33    Review of Systems  Constitutional: Negative for fever and chills.  HENT: Negative for sore throat and tinnitus.   Eyes: Negative for blurred vision and redness.  Respiratory: Negative for cough and shortness of breath.   Cardiovascular: Positive for chest pain and leg swelling. Negative for palpitations, orthopnea and PND.  Gastrointestinal: Negative for nausea, vomiting, abdominal pain and diarrhea.  Genitourinary: Negative for dysuria, urgency and frequency.  Musculoskeletal: Negative for myalgias and joint pain.  Skin: Negative for rash.       No lesions  Neurological: Negative for speech change, focal weakness and weakness.  Endo/Heme/Allergies: Does not bruise/bleed easily.       No temperature intolerance  Psychiatric/Behavioral: Negative for depression and suicidal ideas.    Blood pressure 153/97, pulse 79, temperature 97.8 F (36.6 C), temperature source Oral, resp. rate 20, height '5\' 5"'  (1.651 m), weight 121.02 kg (266 lb 12.8 oz), last menstrual period 03/15/2015, SpO2 98 %. Physical Exam  Vitals reviewed. Constitutional: She is oriented to person, place, and time. She appears well-developed and well-nourished. No distress.  HENT:  Head: Normocephalic and atraumatic.  Mouth/Throat: Oropharynx is clear and moist.  Eyes: EOM are normal. Pupils are equal, round, and reactive to light. No scleral icterus.  Neck: Normal range of motion. Neck supple. No JVD present. No tracheal deviation present. No thyromegaly present.  Cardiovascular: Normal rate, regular rhythm and normal heart sounds.  Exam reveals no gallop and no friction rub.   No murmur heard. Respiratory: Effort normal and breath sounds normal.  GI: Soft. Bowel sounds are normal. She exhibits no distension. There is no tenderness.  Genitourinary:   Deferred  Musculoskeletal: Normal range of motion. She exhibits no edema.  Lymphadenopathy:    She has no cervical adenopathy.  Neurological: She is alert and oriented to person, place, and time. No cranial nerve deficit. She exhibits normal muscle tone.  Skin: Skin is warm and dry. No rash noted. No erythema.  Psychiatric: She has a normal mood and affect. Her behavior is normal. Judgment and thought content normal.     Assessment/Plan This is a 39 year old Caucasian female admitted for chest pain. 1. Chest pain: Atypical. The patient's  pain has been ongoing and intermittent for 4 days. With this recent episode has lasted at least 3 hours. Atypical for cardiac pain however the patient does have significant risk factors including family history and hypertension as well as pain relieved by nitroglycerin. We will trend her cardiac enzymes. I have ordered a cardiology consult as well. Check TSH and hemoglobin A1c. 2. Essential hypertension: The patient has not been taking any medication at home. I have started her on labetalol IV as needed for systolic pressure greater than 160  3. Depression: Continue Zoloft 4. Morbid obesity: BMI is 45; encourage healthy diet and exercise 5. DVT prophylaxis: Heparin 6. GI prophylaxis: None The patient is a full code. Time spent on admission orders and patient care approximately 35 minutes  Harrie Foreman 03/17/2015, 7:22 AM

## 2015-03-17 NOTE — Progress Notes (Addendum)
ANTICOAGULATION CONSULT NOTE - Follow Up Consult  Pharmacy Consult for heparin Indication: chest pain/ACS  No Known Allergies  Patient Measurements: Height: 5\' 5"  (165.1 cm) Weight: 266 lb 12.8 oz (121.02 kg) IBW/kg (Calculated) : 57 Heparin Dosing Weight: 86 kg  Vital Signs: Temp: 97.8 F (36.6 C) (09/27 0344) Temp Source: Oral (09/27 0344) BP: 159/85 mmHg (09/27 0724) Pulse Rate: 79 (09/27 0344)  Labs:  Recent Labs  03/16/15 2358 03/17/15 0429 03/17/15 0903  HGB 13.5  --   --   HCT 40.5  --   --   PLT 157  --   --   APTT 41*  --   --   LABPROT 13.1  --   --   INR 0.97  --   --   HEPARINUNFRC  --   --  0.10*  CREATININE 0.87  --   --   TROPONINI 1.26* 1.79*  --     Estimated Creatinine Clearance: 113.2 mL/min (by C-G formula based on Cr of 0.87).  Assessment: 39 yo female presenting with chest pain. Pharmacy consulted for heparin dosing.   Patient received 4,000 unit bolus and then infusion of 1200u/hr starting at 0200 on 9/27.  Goal of Therapy:  Heparin level 0.3-0.7 units/ml Monitor platelets by anticoagulation protocol: Yes   Plan:  Give 2600 unit bolus X1 Increase infusion rate to 1500 units/hr. Recheck anti-Xa level in 6 hours  (9/27 @ 1700) Continue to monitor H & H and platelets.  Cher Nakai, PharmD Pharmacy Resident

## 2015-03-18 ENCOUNTER — Encounter
Admission: EM | Disposition: A | Discharge status: Home / Self Care | Payer: Self-pay | Source: Home / Self Care | Attending: Specialist

## 2015-03-18 HISTORY — PX: CARDIAC CATHETERIZATION: SHX172

## 2015-03-18 LAB — CBC
HEMATOCRIT: 37.6 % (ref 35.0–47.0)
Hemoglobin: 12.6 g/dL (ref 12.0–16.0)
MCH: 29.4 pg (ref 26.0–34.0)
MCHC: 33.6 g/dL (ref 32.0–36.0)
MCV: 87.4 fL (ref 80.0–100.0)
Platelets: 149 10*3/uL — ABNORMAL LOW (ref 150–440)
RBC: 4.3 MIL/uL (ref 3.80–5.20)
RDW: 13.8 % (ref 11.5–14.5)
WBC: 8.7 10*3/uL (ref 3.6–11.0)

## 2015-03-18 SURGERY — CORONARY STENT INTERVENTION
Anesthesia: Moderate Sedation

## 2015-03-18 MED ORDER — CLOPIDOGREL BISULFATE 75 MG PO TABS
75.0000 mg | ORAL_TABLET | Freq: Every day | ORAL | Status: DC
  Administered 2015-03-19: 75 mg via ORAL
  Filled 2015-03-18: qty 1

## 2015-03-18 MED ORDER — ASPIRIN EC 325 MG PO TBEC
325.0000 mg | DELAYED_RELEASE_TABLET | Freq: Every day | ORAL | Status: DC
  Administered 2015-03-18 – 2015-03-19 (×2): 325 mg via ORAL
  Filled 2015-03-18 (×2): qty 1

## 2015-03-18 MED ORDER — MIDAZOLAM HCL 2 MG/2ML IJ SOLN
INTRAMUSCULAR | Status: AC
  Filled 2015-03-18: qty 2

## 2015-03-18 MED ORDER — MIDAZOLAM HCL 2 MG/2ML IJ SOLN
INTRAMUSCULAR | Status: DC | PRN
  Administered 2015-03-18 (×2): 1 mg via INTRAVENOUS

## 2015-03-18 MED ORDER — OXYCODONE-ACETAMINOPHEN 5-325 MG PO TABS: 1.0000 | ORAL_TABLET | ORAL | Status: DC | PRN

## 2015-03-18 MED ORDER — ACETAMINOPHEN 325 MG PO TABS
650.0000 mg | ORAL_TABLET | ORAL | Status: DC | PRN
  Administered 2015-03-18 – 2015-03-19 (×2): 650 mg via ORAL

## 2015-03-18 MED ORDER — FENTANYL CITRATE (PF) 100 MCG/2ML IJ SOLN
INTRAMUSCULAR | Status: AC
  Filled 2015-03-18: qty 2

## 2015-03-18 MED ORDER — SODIUM CHLORIDE 0.9 % IJ SOLN
3.0000 mL | Freq: Two times a day (BID) | INTRAMUSCULAR | Status: DC
  Administered 2015-03-18 – 2015-03-19 (×2): 3 mL via INTRAVENOUS

## 2015-03-18 MED ORDER — ONDANSETRON HCL 4 MG/2ML IJ SOLN: 4.0000 mg | Freq: Four times a day (QID) | INTRAMUSCULAR | Status: DC | PRN

## 2015-03-18 MED ORDER — IOHEXOL 300 MG/ML  SOLN
INTRAMUSCULAR | Status: DC | PRN
Start: 2015-03-18 — End: 2015-03-18
  Administered 2015-03-18: 265 mL via INTRA_ARTERIAL

## 2015-03-18 MED ORDER — HEPARIN (PORCINE) IN NACL 2-0.9 UNIT/ML-% IJ SOLN
INTRAMUSCULAR | Status: AC
Start: 2015-03-18 — End: 2015-03-18
  Filled 2015-03-18: qty 1000

## 2015-03-18 MED ORDER — SODIUM CHLORIDE 0.9 % IJ SOLN: 3.0000 mL | INTRAMUSCULAR | Status: DC | PRN

## 2015-03-18 MED ORDER — HEPARIN SODIUM (PORCINE) 1000 UNIT/ML IJ SOLN
INTRAMUSCULAR | Status: AC
  Filled 2015-03-18: qty 1

## 2015-03-18 MED ORDER — TIROFIBAN HCL IV 5 MG/100ML
INTRAVENOUS | Status: AC
  Filled 2015-03-18: qty 100

## 2015-03-18 MED ORDER — HEPARIN SODIUM (PORCINE) 1000 UNIT/ML IJ SOLN
INTRAMUSCULAR | Status: DC | PRN
  Administered 2015-03-18: 4000 [IU] via INTRAVENOUS

## 2015-03-18 MED ORDER — SODIUM CHLORIDE 0.9 % WEIGHT BASED INFUSION: 3.0000 mL/kg/h | INTRAVENOUS | Status: AC

## 2015-03-18 MED ORDER — SODIUM CHLORIDE 0.9 % IV SOLN: 250.0000 mL | INTRAVENOUS | Status: DC | PRN

## 2015-03-18 MED ORDER — ONDANSETRON HCL 4 MG/2ML IJ SOLN
INTRAMUSCULAR | Status: AC
  Filled 2015-03-18: qty 2

## 2015-03-18 MED ORDER — NITROGLYCERIN 1 MG/10 ML FOR IR/CATH LAB
INTRA_ARTERIAL | Status: DC | PRN
  Administered 2015-03-18 (×2): 200 ug via INTRACORONARY

## 2015-03-18 MED ORDER — FENTANYL CITRATE (PF) 100 MCG/2ML IJ SOLN
INTRAMUSCULAR | Status: DC | PRN
  Administered 2015-03-18 (×3): 50 ug via INTRAVENOUS

## 2015-03-18 SURGICAL SUPPLY — 13 items
BALLN TREK RX 2.5X15 (BALLOONS) ×3
BALLOON TREK RX 2.5X15 (BALLOONS) IMPLANT
CATH VISTA GUIDE 6FR XB3.5 SH (CATHETERS) ×2 IMPLANT
DEVICE CLOSURE MYNXGRIP 6/7F (Vascular Products) ×2 IMPLANT
DEVICE INFLAT 30 PLUS (MISCELLANEOUS) ×3 IMPLANT
KIT MANI 3VAL PERCEP (MISCELLANEOUS) ×3 IMPLANT
NDL PERC 18GX7CM (NEEDLE) ×1 IMPLANT
NEEDLE PERC 18GX7CM (NEEDLE) ×3 IMPLANT
PACK CARDIAC CATH (CUSTOM PROCEDURE TRAY) ×3 IMPLANT
SHEATH AVANTI 6FR X 11CM (SHEATH) ×5 IMPLANT
STENT XIENCE ALPINE RX 2.75X18 (Permanent Stent) ×2 IMPLANT
WIRE EMERALD 3MM-J .035X150CM (WIRE) ×3 IMPLANT
WIRE G HI TQ BMW 190 (WIRE) ×5 IMPLANT

## 2015-03-18 NOTE — Progress Notes (Addendum)
MEDICATION RELATED CONSULT NOTE - FOLLOW UP   Pharmacy Consult for Aggrestat Indication: NSTEMI PCI   No Known Allergies  Patient Measurements: Body weight: 121 kg; confirmed with RN Crystal. Documentation of weight for 9/28 is incorrect.   Previous weight: 121 kg ; Vital Signs: Temp: 97.7 F (36.5 C) (09/28 0541) Temp Source: Oral (09/28 0541) BP: 146/81 mmHg (09/28 0541) Pulse Rate: 65 (09/28 0541) Intake/Output from previous day: 09/27 0701 - 09/28 0700 In: 493.8 [I.V.:493.8] Out: 1500 [Urine:1500] Intake/Output from this shift: Total I/O In: -  Out: 600 [Urine:600]  Labs:  Recent Labs  03/16/15 2358  WBC 11.2*  HGB 13.5  HCT 40.5  PLT 157  APTT 41*  CREATININE 0.87   Estimated Creatinine Clearance: 97.9 mL/min (by C-G formula based on Cr of 0.87).   Microbiology: No results found for this or any previous visit (from the past 720 hour(s)).  Medications:  Aggrestat for PCI    Assessment: Patient found to have NSTEMI with LAD occlusion being treated with aggrestat for PCI. Patient received bolus dose and was started on aggrestat gtt. Aggrestat gtt has been going for >18 hours. Spoke with MD Juliann Pares and MD stated he would like to continue Aggrestat through PCI. Patient scheduled for PCI @ ~1200 today    Plan:  Continue Aggrestat @ current rate.   MD Callwood would like aggrestat to continue until 0600 on 9/29. Stop date put in.   James,Teldrin D 03/18/2015,11:10 AM

## 2015-03-18 NOTE — Progress Notes (Addendum)
A&O. Independent. Aggrastat infusing well. Groin site unremarkable. No complaints. For PCI today.

## 2015-03-18 NOTE — Care Management (Signed)
Patient to have PCI this day

## 2015-03-18 NOTE — Progress Notes (Addendum)
Nurse report called to nurse crystal 2A, pt sleepy arousable, mynx right groin w/o hematoma old bruising present, aggrastat RPIV continues at 21.8 cc/hr to be stopped at 0100, vss

## 2015-03-18 NOTE — Progress Notes (Signed)
[facility] [place] at [facility]   PATIENT NAME: Breanna Rosario    MR#:  [id number]  DATE OF BIRTH:  09/09/1975  SUBJECTIVE:  CHIEF COMPLAINT:   Chief Complaint  Patient presents with  . Chest Pain   Patient here with chest pain suspected to be due to angina. Currently on Aggrastat and going for PCI later today. No complaints presently and family at bedside.  REVIEW OF SYSTEMS:    Review of Systems  Constitutional: Negative for fever and chills.  HENT: Negative for congestion and tinnitus.   Eyes: Negative for blurred vision and double vision.  Respiratory: Negative for cough, shortness of breath and wheezing.   Cardiovascular: Negative for chest pain, orthopnea and PND.  Gastrointestinal: Negative for nausea, vomiting, abdominal pain and diarrhea.  Genitourinary: Negative for dysuria and hematuria.  Neurological: Negative for dizziness, sensory change and focal weakness.  All other systems reviewed and are negative.   Nutrition: Heart healthy Tolerating Diet: Yes Tolerating PT: Ambulatory   DRUG ALLERGIES:  No Known Allergies  VITALS:  Blood pressure 126/75, pulse 65, temperature 98.3 F (36.8 C), temperature source Oral, resp. rate 20, height 5\' 5"  (1.651 m), weight 117.935 kg (260 lb), last menstrual period 03/15/2015, SpO2 96 %.  PHYSICAL EXAMINATION:   Physical Exam  GENERAL:  39 y.o.-year-old patient lying in the bed with no acute distress.  EYES: Pupils equal, round, reactive to light and accommodation. No scleral icterus. Extraocular muscles intact.  HEENT: Head atraumatic, normocephalic. Oropharynx and nasopharynx clear.  NECK:  Supple, no jugular venous distention. No thyroid enlargement, no tenderness.  LUNGS: Normal breath sounds bilaterally, no wheezing, rales, rhonchi. No use of accessory muscles of respiration.  CARDIOVASCULAR: S1, S2 normal. No murmurs, rubs, or gallops.  ABDOMEN: Soft, nontender, nondistended. Bowel  sounds present. No organomegaly or mass.  EXTREMITIES: No cyanosis, clubbing or edema b/l.    NEUROLOGIC: Cranial nerves II through XII are intact. No focal Motor or sensory deficits b/l.   PSYCHIATRIC: The patient is alert and oriented x 3.  SKIN: No obvious rash, lesion, or ulcer.    LABORATORY PANEL:   CBC  Recent Labs Lab 03/18/15 1105  WBC 8.7  HGB 12.6  HCT 37.6  PLT 149*   ------------------------------------------------------------------------------------------------------------------  Chemistries   Recent Labs Lab 03/16/15 2358  NA 138  K 3.5  CL 107  CO2 26  GLUCOSE 116*  BUN 14  CREATININE 0.87  CALCIUM 8.7*   ------------------------------------------------------------------------------------------------------------------  Cardiac Enzymes  Recent Labs Lab 03/17/15 1520  TROPONINI 1.17*   ------------------------------------------------------------------------------------------------------------------  RADIOLOGY:  Dg Chest 2 View  03/17/2015   CLINICAL DATA:  Chest pain  EXAM: CHEST  2 VIEW  COMPARISON:  10/11/2012  FINDINGS: Normal heart size and mediastinal contours. No acute infiltrate or edema. No effusion or pneumothorax. No acute osseous findings.  IMPRESSION: Negative chest.   Electronically Signed   By: Marnee Spring M.D.   On: 03/17/2015 00:33     ASSESSMENT AND PLAN:   39 year old female with past mental history of hypertension who presented to the hospital with chest pain and noted to have a non-ST elevation MI.  #1 non-ST elevation MI-likely the cause of patient's chest pain. -Currently chest pain-free and hemodynamically stable. Seen by cardiology and plan for PCI and cardiac catheterization later today. -Continue aspirin, Plavix, statin, beta blocker  #2 depression-continue Zoloft.         All the records are reviewed and case discussed with Care Management/Social Workerr.  Management plans discussed with the patient, family  and they are in agreement.  CODE STATUS: Full  DVT Prophylaxis: Heparin  TOTAL TIME TAKING CARE OF THIS PATIENT: 25 minutes.   POSSIBLE D/C IN 1-2 DAYS, DEPENDING ON CLINICAL CONDITION.   Houston Siren M.D on 03/18/2015 at 3:31 PM  Between 7am to 6pm - Pager - [phone]  After 6pm go to [email or web address] - password [email or web address] [facility]  [facility]  Office  [phone]  CC: Primary care physician; No primary care provider on file.

## 2015-03-18 NOTE — Progress Notes (Signed)
   SUBJECTIVE: Pt remains CP free today.    Filed Vitals:   03/17/15 1427 03/17/15 1451 03/17/15 2051 03/18/15 0541  BP: 155/96 148/68 142/93 146/81  Pulse: 68  69 65  Temp:  98.4 F (36.9 C) 98.2 F (36.8 C) 97.7 F (36.5 C)  TempSrc:  Oral Oral Oral  Resp: 18 19 18 16   Height:      Weight:    92.851 kg (204 lb 11.2 oz)  SpO2: 93% 95% 97% 96%    Intake/Output Summary (Last 24 hours) at 03/18/15 1045 Last data filed at 03/18/15 1045  Gross per 24 hour  Intake 493.79 ml  Output   1800 ml  Net -1306.21 ml    LABS: Basic Metabolic Panel:  Recent Labs  95/32/02 2358  NA 138  K 3.5  CL 107  CO2 26  GLUCOSE 116*  BUN 14  CREATININE 0.87  CALCIUM 8.7*   Liver Function Tests: No results for input(s): AST, ALT, ALKPHOS, BILITOT, PROT, ALBUMIN in the last 72 hours. No results for input(s): LIPASE, AMYLASE in the last 72 hours. CBC:  Recent Labs  03/16/15 2358  WBC 11.2*  HGB 13.5  HCT 40.5  MCV 87.7  PLT 157   Cardiac Enzymes:  Recent Labs  03/16/15 2358 03/17/15 0429 03/17/15 1520  TROPONINI 1.26* 1.79* 1.17*   BNP: Invalid input(s): POCBNP D-Dimer: No results for input(s): DDIMER in the last 72 hours. Hemoglobin A1C:  Recent Labs  03/17/15 0429  HGBA1C 5.3   Fasting Lipid Panel:  Recent Labs  03/17/15 0903  CHOL 261*  HDL 31*  LDLCALC 162*  TRIG 339*  CHOLHDL 8.4   Thyroid Function Tests:  Recent Labs  03/16/15 2358  TSH 6.050*   Anemia Panel: No results for input(s): VITAMINB12, FOLATE, FERRITIN, TIBC, IRON, RETICCTPCT in the last 72 hours.   PHYSICAL EXAM General: Well developed, well nourished, in no acute distress HEENT:  Normocephalic and atramatic Neck:  No JVD.  Lungs: Clear bilaterally to auscultation and percussion. Heart: HRRR . Normal S1 and S2 without gallops or murmurs.  Abdomen: Bowel sounds are positive, abdomen soft and non-tender  Msk:  Back normal, normal gait. Normal strength and tone for  age. Extremities: No clubbing, cyanosis or edema.   Neuro: Alert and oriented X 3. Psych:  Good affect, responds appropriately  TELEMETRY: Reviewed telemetry pt in NSR  ASSESSMENT AND PLAN:  Occluded Mid LAD probably for few days and completed MI, she still has normal EF, with WMA anteroseptally. Will get aggrastat and PCI today at 12:30 with Dr. Juliann Pares.              Patient and plan discussed with supervising provider, Dr. Adrian Blackwater, who agrees with above findings.   Alinda Sierras Margarito Courser [facility]  03/18/2015 10:45 AM

## 2015-03-18 NOTE — Care Management (Signed)
At present, patient is not on any cost prohibitive anti platelet/anticoagualtion meds.

## 2015-03-19 ENCOUNTER — Encounter: Payer: Self-pay | Admitting: Internal Medicine

## 2015-03-19 LAB — BASIC METABOLIC PANEL
Anion gap: 6 (ref 5–15)
BUN: 10 mg/dL (ref 6–20)
CHLORIDE: 110 mmol/L (ref 101–111)
CO2: 25 mmol/L (ref 22–32)
CREATININE: 0.68 mg/dL (ref 0.44–1.00)
Calcium: 8.2 mg/dL — ABNORMAL LOW (ref 8.9–10.3)
GFR calc Af Amer: >60 mL/min (ref >60)
GFR calc non Af Amer: >60 mL/min (ref >60)
GLUCOSE: 93 mg/dL (ref 65–99)
Potassium: 3.7 mmol/L (ref 3.5–5.1)
Sodium: 141 mmol/L (ref 135–145)

## 2015-03-19 MED ORDER — METOPROLOL TARTRATE 25 MG PO TABS: 25.0000 mg | ORAL_TABLET | Freq: Two times a day (BID) | ORAL | Status: DC

## 2015-03-19 MED ORDER — CLOPIDOGREL BISULFATE 75 MG PO TABS: 75.0000 mg | ORAL_TABLET | Freq: Every day | ORAL | Status: DC

## 2015-03-19 MED ORDER — ATORVASTATIN CALCIUM 40 MG PO TABS: 40.0000 mg | ORAL_TABLET | Freq: Every day | ORAL | Status: DC

## 2015-03-19 MED ORDER — ASPIRIN 325 MG PO TBEC: 325.0000 mg | DELAYED_RELEASE_TABLET | Freq: Every day | ORAL | Status: DC

## 2015-03-19 NOTE — Progress Notes (Signed)
   SUBJECTIVE: Pt feeling well, no CP. Mild groin pain.    Filed Vitals:   03/18/15 1505 03/18/15 1935 03/19/15 0424 03/19/15 0800  BP: 126/75 131/77 139/90 147/81  Pulse: 65 76 71 65  Temp: 98.3 F (36.8 C) 98.6 F (37 C) 98.3 F (36.8 C)   TempSrc: Oral Oral Oral   Resp: 20 22 23 20   Height:      Weight:   121.065 kg (266 lb 14.4 oz)   SpO2: 96% 97% 96% 99%    Intake/Output Summary (Last 24 hours) at 03/19/15 0841 Last data filed at 03/19/15 0131  Gross per 24 hour  Intake 998.21 ml  Output   1300 ml  Net -301.79 ml    LABS: Basic Metabolic Panel:  Recent Labs  25/42/70 2358 03/19/15 0622  NA 138 141  K 3.5 3.7  CL 107 110  CO2 26 25  GLUCOSE 116* 93  BUN 14 10  CREATININE 0.87 0.68  CALCIUM 8.7* 8.2*   Liver Function Tests: No results for input(s): AST, ALT, ALKPHOS, BILITOT, PROT, ALBUMIN in the last 72 hours. No results for input(s): LIPASE, AMYLASE in the last 72 hours. CBC:  Recent Labs  03/16/15 2358 03/18/15 1105  WBC 11.2* 8.7  HGB 13.5 12.6  HCT 40.5 37.6  MCV 87.7 87.4  PLT 157 149*   Cardiac Enzymes:  Recent Labs  03/16/15 2358 03/17/15 0429 03/17/15 1520  TROPONINI 1.26* 1.79* 1.17*   BNP: Invalid input(s): POCBNP D-Dimer: No results for input(s): DDIMER in the last 72 hours. Hemoglobin A1C:  Recent Labs  03/17/15 0429  HGBA1C 5.3   Fasting Lipid Panel:  Recent Labs  03/17/15 0903  CHOL 261*  HDL 31*  LDLCALC 162*  TRIG 339*  CHOLHDL 8.4   Thyroid Function Tests:  Recent Labs  03/16/15 2358  TSH 6.050*   Anemia Panel: No results for input(s): VITAMINB12, FOLATE, FERRITIN, TIBC, IRON, RETICCTPCT in the last 72 hours.   PHYSICAL EXAM General: Well developed, well nourished, in no acute distress HEENT:  Normocephalic and atramatic Neck:  No JVD.  Lungs: Clear bilaterally to auscultation and percussion. Heart: HRRR . Normal S1 and S2 without gallops or murmurs.  Abdomen: Bowel sounds are positive,  abdomen soft and non-tender  Msk:  Back normal, normal gait. Normal strength and tone for age. Extremities: No clubbing, cyanosis or edema.   Neuro: Alert and oriented X 3. Psych:  Good affect, responds appropriately  TELEMETRY: Reviewed telemetry pt in NSR  ASSESSMENT AND PLAN: Pt with NSTEMi s/p successful PCI to LAD. Ok to go home with f/u in office 10/4 at 9am. Advise d/c with asa 325mg , plavix, and lipitor.    Patient and plan discussed with supervising provider, Dr. Adrian Blackwater, who agrees with above findings.   Alinda Sierras Margarito Courser [facility]  03/19/2015 8:41 AM

## 2015-03-19 NOTE — Progress Notes (Signed)
Discharge instructions explained to pt/ verbalized an understanding/ iv and tele removed/ rx given to pt/ will transport  Off unit via wheelchiar

## 2015-03-19 NOTE — Discharge Instructions (Signed)

## 2015-03-19 NOTE — Discharge Summary (Signed)
[facility] - [place] at [facility]   PATIENT NAME: Breanna Rosario    MR#:  [id number]  DATE OF BIRTH:  [birth year]  DATE OF ADMISSION:  03/17/2015 ADMITTING PHYSICIAN: Arnaldo Natal, MD  DATE OF DISCHARGE: 03/19/2015  1:16 PM  PRIMARY CARE PHYSICIAN: Dr. Adrian Blackwater    ADMISSION DIAGNOSIS:  NSTEMI (non-ST elevated myocardial infarction) [I21.4] Ischemic chest pain [I20.9]  DISCHARGE DIAGNOSIS:  Active Problems:   Chest pain   SECONDARY DIAGNOSIS:   Past Medical History  Diagnosis Date  . Hypertension     HOSPITAL COURSE:   39 year old female with past mental history of hypertension who presented to the hospital with chest pain and noted to have a non-ST elevation MI.  #1 non-ST elevation MI-likely the cause of patient's chest pain. -Patient was admitted to the hospital and started on supportive care with aspirin, beta blocker, statin, Aggrastat. Patient was seen by cardiology and underwent cardiac catheterization which showed a mid LAD lesion. Patient underwent drug-eluting stent placement to the lesion with good flow post catheterization. -Patient is currently chest pain-free and hemodynamically stable. Presently she is being discharged on aspirin, Plavix, metoprolol, atorvastatin. She will have follow-up with cardiology in the next 1-2 weeks.  #2 depression-patient will continue Zoloft.   DISCHARGE CONDITIONS:   Stable  CONSULTS OBTAINED:  Treatment Team:  Laurier Nancy, MD  DRUG ALLERGIES:  No Known Allergies  DISCHARGE MEDICATIONS:   Discharge Medication List as of 03/19/2015 11:31 AM    START taking these medications   Details  aspirin EC 325 MG EC tablet Take 1 tablet (325 mg total) by mouth daily., Starting 03/19/2015, Until Discontinued, Print    atorvastatin (LIPITOR) 40 MG tablet Take 1 tablet (40 mg total) by mouth daily at 6 PM., Starting 03/19/2015, Until Discontinued, Print    clopidogrel (PLAVIX) 75 MG tablet  Take 1 tablet (75 mg total) by mouth daily with breakfast., Starting 03/19/2015, Until Discontinued, Print    metoprolol tartrate (LOPRESSOR) 25 MG tablet Take 1 tablet (25 mg total) by mouth 2 (two) times daily., Starting 03/19/2015, Until Discontinued, Print      CONTINUE these medications which have NOT CHANGED   Details  sertraline (ZOLOFT) 50 MG tablet Take 1 tablet by mouth daily., Starting 02/20/2015, Until Discontinued, Historical Med         DISCHARGE INSTRUCTIONS:   DIET:  Cardiac diet  DISCHARGE CONDITION:  Stable  ACTIVITY:  Activity as tolerated  OXYGEN:  Home Oxygen: No.   Oxygen Delivery: room air  DISCHARGE LOCATION:  home   If you experience worsening of your admission symptoms, develop shortness of breath, life threatening emergency, suicidal or homicidal thoughts you must seek medical attention immediately by calling 911 or calling your MD immediately  if symptoms less severe.  You Must read complete instructions/literature along with all the possible adverse reactions/side effects for all the Medicines you take and that have been prescribed to you. Take any new Medicines after you have completely understood and accpet all the possible adverse reactions/side effects.   Please note  You were cared for by a hospitalist during your hospital stay. If you have any questions about your discharge medications or the care you received while you were in the hospital after you are discharged, you can call the unit and asked to speak with the hospitalist on call if the hospitalist that took care of you is not available. Once you are discharged, your primary care physician will handle any  further medical issues. Please note that NO REFILLS for any discharge medications will be authorized once you are discharged, as it is imperative that you return to your primary care physician (or establish a relationship with a primary care physician if you do not have one) for your  aftercare needs so that they can reassess your need for medications and monitor your lab values.     Today   Currently chest pain-free and hemodynamically stable. Status post catheterization yesterday and drug-eluting stent to the mid LAD.  VITAL SIGNS:  Blood pressure 147/81, pulse 65, temperature 98.3 F (36.8 C), temperature source Oral, resp. rate 20, height  (1.651 m), weight 121.065 kg (266 lb 14.4 oz), last menstrual period 03/15/2015, SpO2 99 %.  I/O:   Intake/Output Summary (Last 24 hours) at 03/19/15 1625 Last data filed at 03/19/15 1000  Gross per 24 hour  Intake 1118.21 ml  Output      0 ml  Net 1118.21 ml    PHYSICAL EXAMINATION:  GENERAL:  39 y.o.-year-old obese patient lying in the bed with no acute distress.  EYES: Pupils equal, round, reactive to light and accommodation. No scleral icterus. Extraocular muscles intact.  HEENT: Head atraumatic, normocephalic. Oropharynx and nasopharynx clear.  NECK:  Supple, no jugular venous distention. No thyroid enlargement, no tenderness.  LUNGS: Normal breath sounds bilaterally, no wheezing, rales,rhonchi. No use of accessory muscles of respiration.  CARDIOVASCULAR: S1, S2 normal. No murmurs, rubs, or gallops.  ABDOMEN: Soft, non-tender, non-distended. Bowel sounds present. No organomegaly or mass.  EXTREMITIES: No pedal edema, cyanosis, or clubbing.  NEUROLOGIC: Cranial nerves II through XII are intact. No focal motor or sensory defecits b/l.  PSYCHIATRIC: The patient is alert and oriented x 3. Good affect.  SKIN: No obvious rash, lesion, or ulcer.   DATA REVIEW:   CBC  Recent Labs Lab 03/18/15 1105  WBC 8.7  HGB 12.6  HCT 37.6  PLT 149*    Chemistries   Recent Labs Lab 03/19/15 0622  NA 141  K 3.7  CL 110  CO2 25  GLUCOSE 93  BUN 10  CREATININE 0.68  CALCIUM 8.2*    Cardiac Enzymes  Recent Labs Lab 03/17/15 1520  TROPONINI 1.17*    Microbiology Results  No results found for this or  any previous visit.  RADIOLOGY:  No results found.    Management plans discussed with the patient, family and they are in agreement.  CODE STATUS:   TOTAL TIME TAKING CARE OF THIS PATIENT: 40 minutes.    Houston Siren M.D on 03/19/2015 at 4:25 PM  Between 7am to 6pm - Pager - [phone]  After 6pm go to [email or web address] - password [email or web address] [facility]  [facility]  Office  [phone]  CC: Primary care physician; No primary care provider on file.

## 2015-03-21 DIAGNOSIS — I219 Acute myocardial infarction, unspecified: Secondary | ICD-10-CM

## 2015-03-21 HISTORY — DX: Acute myocardial infarction, unspecified: I21.9

## 2015-04-11 ENCOUNTER — Ambulatory Visit
Admission: EM | Admit: 2015-04-11 | Discharge: 2015-04-11 | Disposition: A | Discharge status: Home / Self Care | Payer: BC Managed Care – PPO | Attending: Emergency Medicine | Admitting: Emergency Medicine

## 2015-04-11 ENCOUNTER — Encounter: Payer: Self-pay | Admitting: *Deleted

## 2015-04-11 ENCOUNTER — Ambulatory Visit (INDEPENDENT_AMBULATORY_CARE_PROVIDER_SITE_OTHER): Payer: BC Managed Care – PPO

## 2015-04-11 DIAGNOSIS — H109 Unspecified conjunctivitis: Secondary | ICD-10-CM

## 2015-04-11 DIAGNOSIS — J189 Pneumonia, unspecified organism: Secondary | ICD-10-CM

## 2015-04-11 MED ORDER — MOMETASONE FUROATE 50 MCG/ACT NA SUSP: 2.0000 | Freq: Every day | NASAL | Status: DC

## 2015-04-11 MED ORDER — HYDROCOD POLST-CPM POLST ER 10-8 MG/5ML PO SUER: 5.0000 mL | Freq: Two times a day (BID) | ORAL | Status: DC | PRN

## 2015-04-11 MED ORDER — AZITHROMYCIN 250 MG PO TABS: 250.0000 mg | ORAL_TABLET | Freq: Every day | ORAL | Status: DC

## 2015-04-11 MED ORDER — CIPROFLOXACIN HCL 0.3 % OP SOLN: OPHTHALMIC | Status: DC

## 2015-04-11 MED ORDER — ALBUTEROL SULFATE HFA 108 (90 BASE) MCG/ACT IN AERS: 2.0000 | INHALATION_SPRAY | RESPIRATORY_TRACT | Status: DC | PRN

## 2015-04-11 NOTE — ED Provider Notes (Signed)
HPI  SUBJECTIVE:  Breanna Rosario is a 39 y.o. female who presents with nonproductive cough for the past 3 days. She reports some shortness of breath, wheezing, mild dyspnea on exertion, inability sleep at night secondary to the coughing. She reports right-sided sore throat and right ear pain yesterday, this has resolved. No nausea, vomiting, fevers, chest congestion, nasal congestion, postnasal drip, chest pain, bodyaches, headache. Symptoms are worse at night, better with cool air, Delsym. She has not tried anything else for this. She works in a daycare. Patient also reports bilateral pink eye on the starting last night in the right side, woke up this morning also on the left side. Reports increased tearing, "goopy" eyes, crusting. Her daughter currently has pink eye. No foreign body sensation, gritty eye sensation, visual changes, headache, periorbital erythema, edema, pain with EOMs, allergy type symptoms, halos around lights, symptoms worse in the dark, facial rash. She wears glasses. She does not wear contacts. Past medical history of  coronary artery disease status post stent last month, MI, hypertension, bronchitis. No history of asthma, emphysema, COPD, diabetes. She is not a smoker. No history of glaucoma. LMP now.    Past Medical History  Diagnosis Date  . Hypertension     Past Surgical History  Procedure Laterality Date  . None    . Cardiac catheterization Right 03/17/2015    Procedure: Left Heart Cath and Coronary Angiography;  Surgeon: Laurier Nancy, MD;  Location: [facility] INVASIVE CV LAB;  Service: Cardiovascular;  Laterality: Right;  . Cardiac catheterization N/A 03/18/2015    Procedure: Coronary Stent Intervention;  Surgeon: Alwyn Pea, MD;  Location: [facility] INVASIVE CV LAB;  Service: Cardiovascular;  Laterality: N/A;    Family History  Problem Relation Age of Onset  . Coronary artery disease Father     Died at age 39; four-vessel bypass at 39 years of age    Social  History  Substance Use Topics  . Smoking status: Former Games developer  . Smokeless tobacco: None  . Alcohol Use: No    No current facility-administered medications for this encounter.  Current outpatient prescriptions:  .  amLODipine-benazepril (LOTREL) 5-10 MG capsule, Take 1 capsule by mouth daily., Disp: , Rfl:  .  albuterol (PROVENTIL HFA;VENTOLIN HFA) 108 (90 BASE) MCG/ACT inhaler, Inhale 2 puffs into the lungs every 4 (four) hours as needed for wheezing or shortness of breath. Dispense with aerochamber, Disp: 1 Inhaler, Rfl: 0 .  aspirin EC 325 MG EC tablet, Take 1 tablet (325 mg total) by mouth daily., Disp: 30 tablet, Rfl: 0 .  atorvastatin (LIPITOR) 40 MG tablet, Take 1 tablet (40 mg total) by mouth daily at 6 PM., Disp: 60 tablet, Rfl: 1 .  azithromycin (ZITHROMAX) 250 MG tablet, Take 1 tablet (250 mg total) by mouth daily. 2 tabs po on day 1, 1 tab po on days 2-5, Disp: 6 tablet, Rfl: 0 .  chlorpheniramine-HYDROcodone (TUSSIONEX PENNKINETIC ER) 10-8 MG/5ML SUER, Take 5 mLs by mouth every 12 (twelve) hours as needed for cough., Disp: 120 mL, Rfl: 0 .  ciprofloxacin (CILOXAN) 0.3 % ophthalmic solution, 1-2 drops in affected eye 4 times/day x 5 days, Disp: 5 mL, Rfl: 0 .  clopidogrel (PLAVIX) 75 MG tablet, Take 1 tablet (75 mg total) by mouth daily with breakfast., Disp: 60 tablet, Rfl: 1 .  metoprolol tartrate (LOPRESSOR) 25 MG tablet, Take 1 tablet (25 mg total) by mouth 2 (two) times daily., Disp: 60 tablet, Rfl: 1 .  mometasone (NASONEX) 50  MCG/ACT nasal spray, Place 2 sprays into the nose daily., Disp: 17 g, Rfl: 0 .  sertraline (ZOLOFT) 50 MG tablet, Take 1 tablet by mouth daily., Disp: , Rfl: 3  No Known Allergies   ROS  As noted in HPI.   Physical Exam  BP 126/70 mmHg  Pulse 60  Temp(Src) 98 F (36.7 C) (Oral)  Ht  (1.651 m)  Wt 268 lb (121.564 kg)  BMI 44.60 kg/m2  SpO2 100%  LMP 04/09/2015  Constitutional: Well developed, well nourished, no acute  distress Eyes: PERRL, EOMI, mild bilateral conjunctival injection. Positive purulent drainage, no pain with EOMs. No foreign body seen on lid eversion. No photophobia. Fluorescein exam negative for corneal abrasion bilaterally. No periorbital erythema, edema.  HENT: Normocephalic, atraumatic,mucus membranes moist. Positive nasal congestion. No sinus tenderness. TMs normal bilaterally. Oropharynx normal Respiratory: wheezing, ronchi that clears with coughing on the left. No rales, rhonchi. Cardiovascular: Normal rate and rhythm, no murmurs, no gallops, no rubs GI: nondistended, skin: No rash, skin intact Musculoskeletal:  no deformities Neurologic: Alert & oriented x 3, CN II-XII grossly intact, no motor deficits, sensation grossly intact Psychiatric: Speech and behavior appropriate   ED Course   Medications - No data to display  Orders Placed This Encounter  Procedures  . DG Chest 2 View    Standing Status: Standing     Number of Occurrences: 1     Standing Expiration Date:     Order Specific Question:  Reason for Exam (SYMPTOM  OR DIAGNOSIS REQUIRED)    Answer:  cough, focal wheezing  L r/o PNA   No results found for this or any previous visit (from the past 24 hour(s)). Dg Chest 2 View  04/11/2015  CLINICAL DATA:  Wheezing and cough 2 days. EXAM: CHEST  2 VIEW COMPARISON:  03/17/2015 FINDINGS: Lungs are adequately inflated and demonstrate opacification over the posterior left lower lobe which may be due to atelectasis versus early infection. No evidence of effusion. Cardiomediastinal silhouette and remainder of the exam is within normal. IMPRESSION: Left base opacification likely due to infection and less likely atelectasis. Electronically Signed   By: Elberta Fortis M.D.   On: 04/11/2015 13:54    ED Clinical Impression  Community acquired pneumonia  Bilateral conjunctivitis  ED Assessment/Plan Reviewed imaging independently. + LLL  pneumonia. See radiology report for details.    1: Pneumonia  Home with albuterol, azithromycin, Mucinex, saline nasal irrigation, nasal steroid, and Tussionex.   2, conjunctivitis. Viral versus bacterial. No corneal abrasion, periorbital cellulitis. Home with cipro eyedrops, Systane, cool compresses.  Follow-up with PMD in several days. Return here or Go to the ER if gets worse. Patient agrees with plan.  Discussed labs, imaging, MDM, plan and followup with patient / parent / family. Discussed sn/sx that should prompt return to the UC or ED. Patient / parent agrees with plan. Family agrees with plan  *This clinic note was created using Dragon dictation software. Therefore, there may be occasional mistakes despite careful proofreading.  ?   Domenick Gong, MD 04/11/15 1425

## 2015-04-11 NOTE — Discharge Instructions (Signed)
2 puffs from your albuterol inhaler every 4-6 hours. Plain Mucinex to help loosen secretions. Finish the azithromycin. Saline nasal irrigation , tussionex as needed for cough. Nasal steroids will help with nasal congestion and postnasal drip. This is optional.   Conjunctivitis: Systane lubricating eyedrops, cool compresses, wash hands frequently. Wait-and-see and prescription of antibiotic eyedrop- certain several days if not improving on your own.Marland Kitchen

## 2015-04-11 NOTE — ED Notes (Signed)
Pt states that she started with drainage from both eyes yesterday and cough that started on Tuesday.

## 2016-02-23 IMAGING — CR DG CHEST 2V
2 series · 2 of 2 positions shown · non-contrast
Comparison: 03/17/2015

CLINICAL DATA: Wheezing and cough 2 days.

EXAM:
CHEST  2 VIEW

[chest pa]
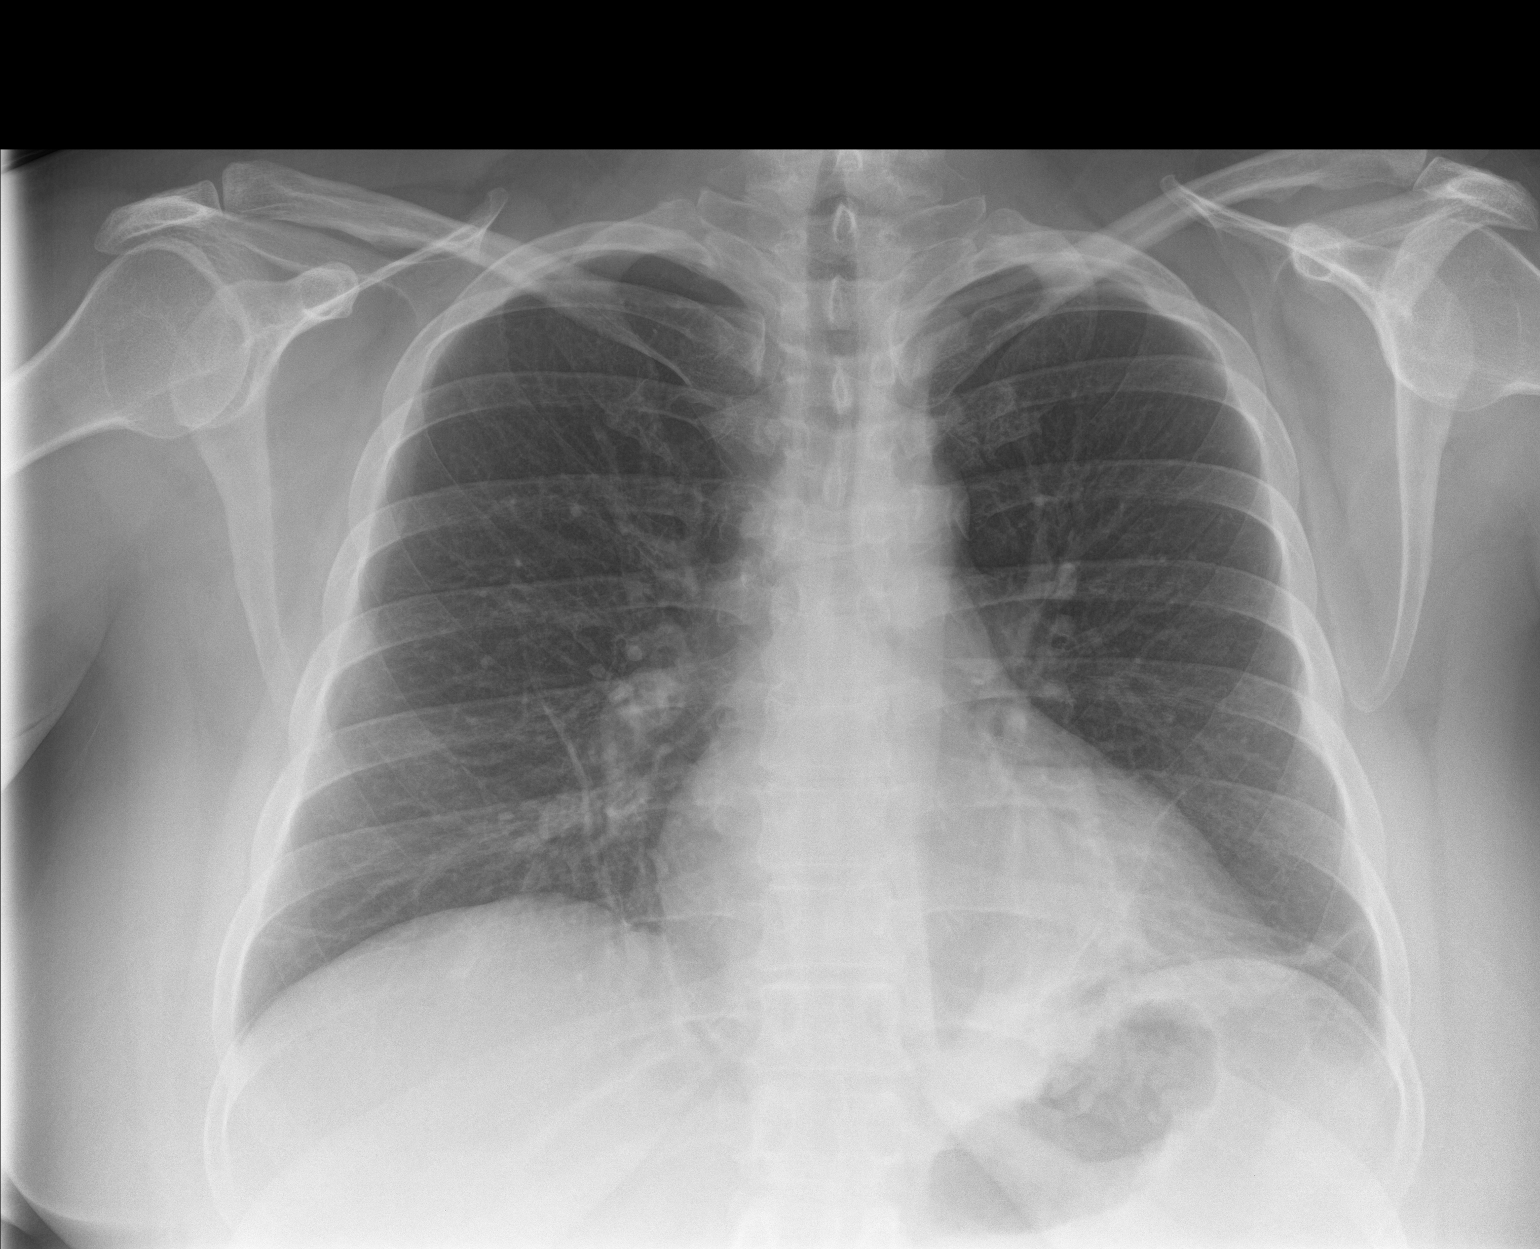

[chest lat]
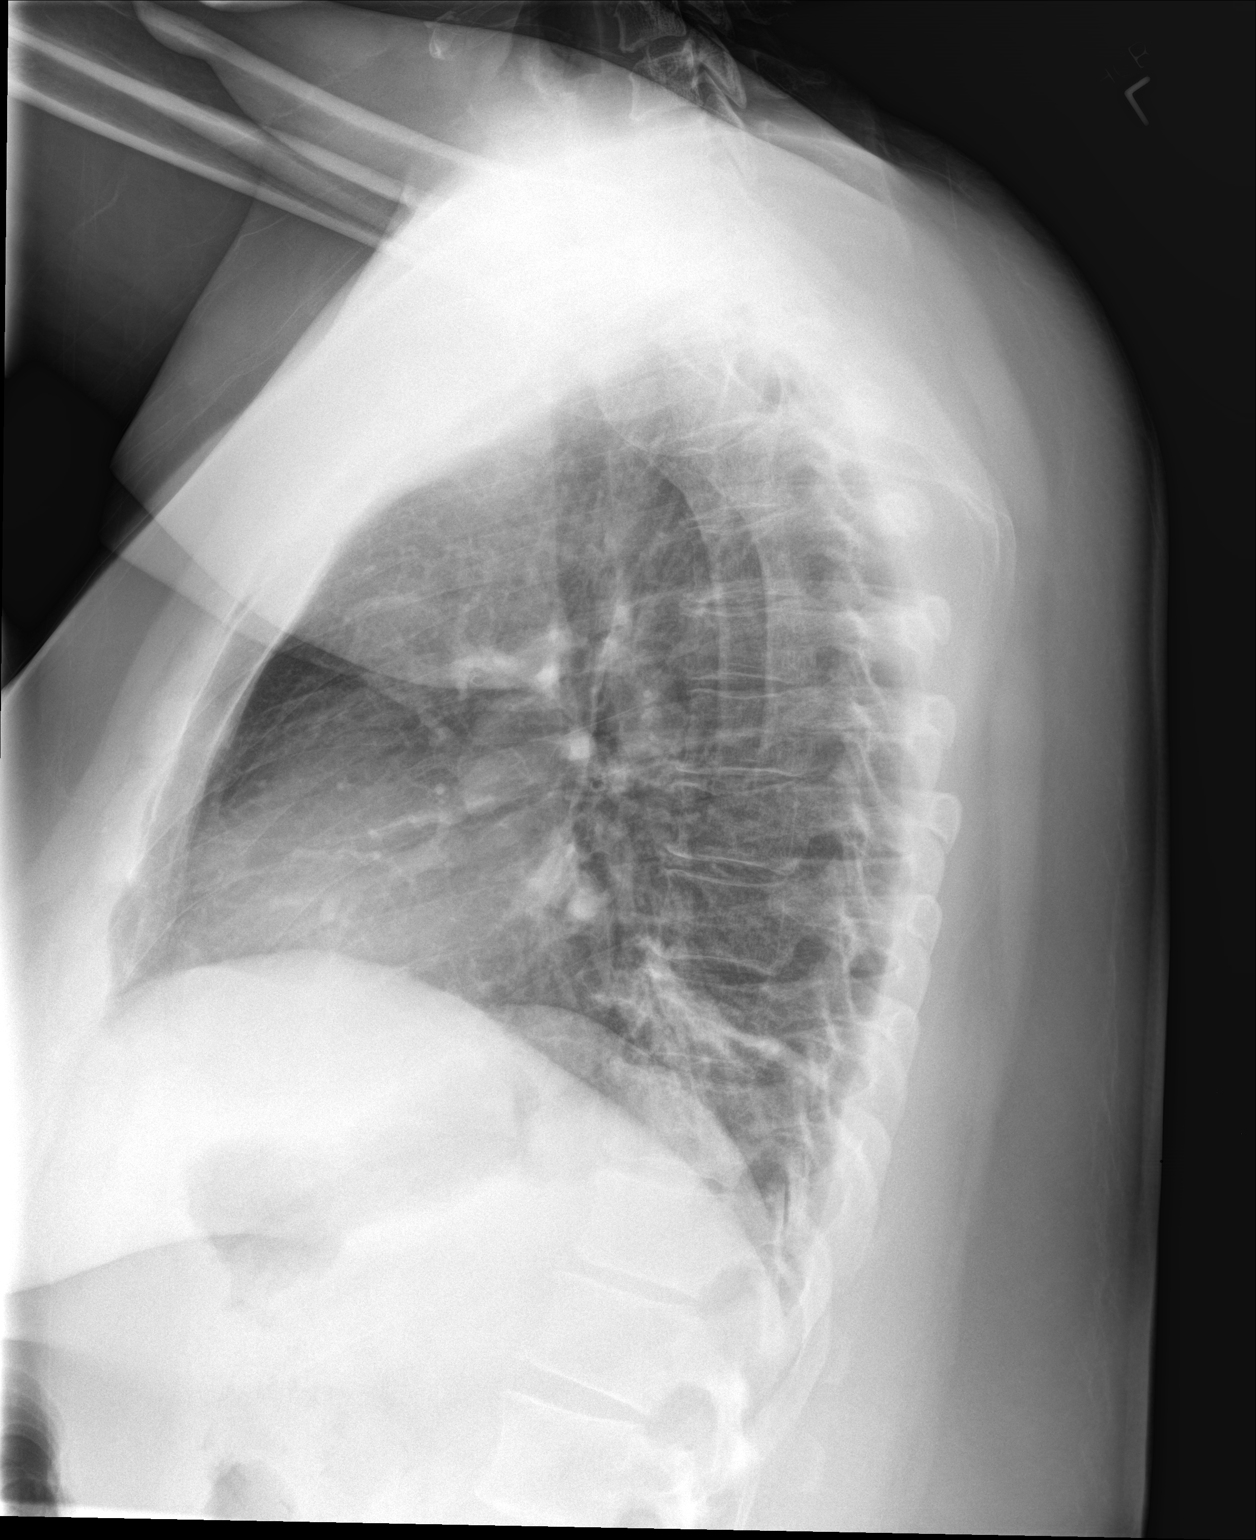

[2 of 2 positions shown; findings below may reference images not displayed]

FINDINGS: Lungs are adequately inflated and demonstrate opacification over the
posterior left lower lobe which may be due to atelectasis versus
early infection. No evidence of effusion. Cardiomediastinal
silhouette and remainder of the exam is within normal.
IMPRESSION: Left base opacification likely due to infection and less likely
atelectasis.

## 2016-08-29 ENCOUNTER — Ambulatory Visit: Payer: Self-pay | Admitting: Family Medicine

## 2016-08-30 ENCOUNTER — Encounter: Payer: Self-pay | Admitting: Family Medicine

## 2016-08-30 ENCOUNTER — Ambulatory Visit (INDEPENDENT_AMBULATORY_CARE_PROVIDER_SITE_OTHER): Payer: BC Managed Care – PPO | Admitting: Family Medicine

## 2016-08-30 VITALS — BP 127/81 | HR 74 | Temp 97.7°F | Wt 296.0 lb

## 2016-08-30 DIAGNOSIS — L309 Dermatitis, unspecified: Secondary | ICD-10-CM

## 2016-08-30 DIAGNOSIS — J069 Acute upper respiratory infection, unspecified: Secondary | ICD-10-CM | POA: Diagnosis not present

## 2016-08-30 DIAGNOSIS — F331 Major depressive disorder, recurrent, moderate: Secondary | ICD-10-CM

## 2016-08-30 DIAGNOSIS — B9789 Other viral agents as the cause of diseases classified elsewhere: Secondary | ICD-10-CM

## 2016-08-30 DIAGNOSIS — F339 Major depressive disorder, recurrent, unspecified: Secondary | ICD-10-CM | POA: Insufficient documentation

## 2016-08-30 MED ORDER — TRIAMCINOLONE ACETONIDE 0.1 % EX CREA: 1.0000 "application " | TOPICAL_CREAM | Freq: Two times a day (BID) | CUTANEOUS | 6 refills | Status: DC

## 2016-08-30 MED ORDER — SERTRALINE HCL 50 MG PO TABS: 50.0000 mg | ORAL_TABLET | Freq: Every day | ORAL | 1 refills | Status: DC

## 2016-08-30 NOTE — Progress Notes (Signed)
BP 127/81   Pulse 74   Temp 97.7 F (36.5 C)   Wt 296 lb (134.3 kg)   LMP 08/29/2016 (Exact Date)   SpO2 97%   BMI 49.26 kg/m    Subjective:    Patient ID: Breanna Rosario, female    DOB: Aug 08, 1975, 41 y.o.   MRN: [id number]  HPI: Breanna Rosario is a 41 y.o. female  Chief Complaint  Patient presents with  . URI    x 5-6 days, is improving. Cough, occasionally productive, chest congestion. No fever, no sore throat, no head congestion.   URI Patient presents to clinic complaining of occasionally productive cough with grey sputum, chest congestion x 5-6 days and improving today. Denies fever/chills, myalgias, sore throat, head congestion, shortness of breath, wheezing, chest pain. Cough is worse at night but was able to sleep through the night yesterday without any medications. Taking OTC cough medicine and cough drops with mild relief Husband and daughter have had recent URIs. No flu shot this year. Former smoker for 7 years and quit in 2001. She has had a history of pneumonia and no hospitalizations. No inhaler use and not currently taking allergy medications.   Depression Patient with a history of depression states she would like to begin Zoloft again as stresses in her life had recently made her have crying spells. She is planning to drive to [place] soon to see family and would like Zoloft to help her manage her depression.   Eczema Patient states her fingers have been itching from her eczema and would like relief.   Relevant past medical, surgical, family and social history reviewed and updated as indicated. Interim medical history since our last visit reviewed. Allergies and medications reviewed and updated.  Review of Systems  Constitutional: Negative for chills, fatigue and fever.  HENT: Positive for congestion. Negative for ear discharge, ear pain, rhinorrhea, sinus pain, sinus pressure, sore throat and trouble swallowing.   Eyes: Negative.  Negative for pain,  discharge and itching.  Respiratory: Positive for cough. Negative for apnea, chest tightness, shortness of breath, wheezing and stridor.   Cardiovascular: Negative for chest pain and palpitations.  Gastrointestinal: Negative for abdominal pain, constipation, diarrhea, nausea and vomiting.  Musculoskeletal: Negative for myalgias.  Skin: Positive for rash (fingers).  Neurological: Negative for dizziness, weakness, light-headedness and headaches.  Psychiatric/Behavioral: Positive for dysphoric mood.   Per HPI unless specifically indicated above    Objective:    BP 127/81   Pulse 74   Temp 97.7 F (36.5 C)   Wt 296 lb (134.3 kg)   LMP 08/29/2016 (Exact Date)   SpO2 97%   BMI 49.26 kg/m   Wt Readings from Last 3 Encounters:  08/30/16 296 lb (134.3 kg)  04/11/15 268 lb (121.6 kg)  03/19/15 266 lb 14.4 oz (121.1 kg)    Physical Exam  Constitutional: She is oriented to person, place, and time. She appears well-developed and well-nourished. No distress.  HENT:  Head: Normocephalic and atraumatic.  Right Ear: External ear normal.  Left Ear: External ear normal.  Nose: Nose normal.  Mouth/Throat: Oropharynx is clear and moist. No oropharyngeal exudate.  Right ear: clear fluid behind tympanic membrane. No erythema, drainage, swelling. Nonbulging. Left ear: normal tympanic membrane.  Eyes: Conjunctivae are normal. Right eye exhibits no discharge. Left eye exhibits no discharge.  Neck: Normal range of motion. Neck supple.  Cardiovascular: Normal rate, regular rhythm, normal heart sounds and intact distal pulses.   Pulmonary/Chest: Effort normal  and breath sounds normal. No stridor. No respiratory distress. She has no wheezes. She has no rales.  Abdominal: Soft.  Musculoskeletal: Normal range of motion.  Neurological: She is alert and oriented to person, place, and time.  Skin: Skin is warm and dry. Rash (irritated skin on b/l hands) noted.  Psychiatric: She has a normal mood and  affect. Her behavior is normal. Judgment and thought content normal.      Assessment & Plan:   Problem List Items Addressed This Visit      Musculoskeletal and Integument   Eczema    Triamcinolone cream prescribed. Advised to apply moisturizer often.         Other   Major depression, recurrent (HCC)    90 day supply of Zoloft refilled and patient will return in 3 months for check up.       Relevant Medications   sertraline (ZOLOFT) 50 MG tablet    Other Visit Diagnoses    Upper respiratory infection, viral    -  Primary   Advised to begin using Flonase and Zyrtec. Strict return precautions if symptoms worsen or she develops fever/chills, shortness of breath, chest pain.      Low suspicion for pneumonia due to no shortness of breath, pleuritic chest pain and patient is afebrile. Patient given strict return precautions and advised to seek care while traveling if she becomes concerned for pneumonia.   Follow up plan: Return in about 3 months (around 11/30/2016) for depression follow up.

## 2016-08-30 NOTE — Assessment & Plan Note (Signed)
90 day supply of Zoloft refilled and patient will return in 3 months for check up.

## 2016-08-30 NOTE — Assessment & Plan Note (Addendum)
Triamcinolone cream prescribed. Advised to apply moisturizer often.

## 2016-09-01 NOTE — Patient Instructions (Signed)
Follow up in 3 months

## 2017-02-13 ENCOUNTER — Other Ambulatory Visit: Payer: Self-pay | Admitting: Family Medicine

## 2017-02-13 NOTE — Telephone Encounter (Signed)
Routing to provider. No follow up on file. 

## 2017-03-21 ENCOUNTER — Other Ambulatory Visit: Payer: Self-pay | Admitting: Family Medicine

## 2017-03-22 NOTE — Telephone Encounter (Signed)
Routing to provider. No follow up on file. 

## 2017-04-12 ENCOUNTER — Other Ambulatory Visit: Payer: Self-pay | Admitting: Family Medicine

## 2017-04-12 NOTE — Telephone Encounter (Signed)
Copied from CRM [id number]. Topic: Quick Communication - See Telephone Encounter >> Apr 12, 2017  3:27 PM Windy Kalata, NT wrote: CRM for notification. See Telephone encounter for:  04/12/17.  Pt is calling to get her Zoloft prescription refilled for another month until she is able to come into the office.

## 2017-04-14 MED ORDER — SERTRALINE HCL 50 MG PO TABS: ORAL_TABLET | ORAL | 0 refills | Status: DC

## 2017-04-14 NOTE — Telephone Encounter (Signed)
Left a message for patient to call to schedule a follow up appointment.

## 2017-04-14 NOTE — Telephone Encounter (Signed)
Pt already 4 months overdue for follow up. I will give her 15 pills to bridge her but she must come in ASAP.

## 2017-04-14 NOTE — Telephone Encounter (Signed)
Pr requesting refill on Zoloft.

## 2017-04-18 NOTE — Telephone Encounter (Signed)
Left message to schedule appt

## 2017-04-18 NOTE — Telephone Encounter (Signed)
Patient called and scheduled appt for 04/20/17.

## 2017-04-20 ENCOUNTER — Ambulatory Visit: Payer: BC Managed Care – PPO | Admitting: Family Medicine

## 2017-04-24 ENCOUNTER — Ambulatory Visit: Payer: BC Managed Care – PPO | Admitting: Family Medicine

## 2017-04-24 ENCOUNTER — Encounter: Payer: Self-pay | Admitting: Family Medicine

## 2017-04-24 VITALS — BP 130/82 | HR 74 | Temp 98.7°F | Wt 301.0 lb

## 2017-04-24 DIAGNOSIS — F331 Major depressive disorder, recurrent, moderate: Secondary | ICD-10-CM | POA: Diagnosis not present

## 2017-04-24 DIAGNOSIS — R946 Abnormal results of thyroid function studies: Secondary | ICD-10-CM | POA: Diagnosis not present

## 2017-04-24 DIAGNOSIS — Z6841 Body Mass Index (BMI) 40.0 and over, adult: Secondary | ICD-10-CM

## 2017-04-24 MED ORDER — SERTRALINE HCL 50 MG PO TABS: ORAL_TABLET | ORAL | 5 refills | Status: DC

## 2017-04-24 NOTE — Progress Notes (Signed)
BP 130/82   Pulse 74   Temp 98.7 F (37.1 C)   Wt (!) 301 lb (136.5 kg)   SpO2 97%   BMI 50.09 kg/m    Subjective:    Patient ID: Breanna Rosario, female    DOB: Jun 08, 1976, 41 y.o.   MRN: [id number]  HPI: Breanna Rosario is a 41 y.o. female  Chief Complaint  Patient presents with  . Depression    follow up on the Sertraline  . Obesity    interested in trying to lose weight   Patient presents today for depression follow up after restarting sertraline. Doing fairly well with it, wanting to keep dose the same as she feels it has been helping level her moods out quite a bit. Denies side effects, SI/HI.   Patient also wanting to discuss her weight today. Has gained 40 lb the past 2 years and is very unhappy with how she looks and feels. 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 About to restart an office job and concerned that the issue will worsen. Has tried several different diets and seems like nothing helps. Does have a hx of slightly elevated TSH back in 2016 but has never had medication for this.   Past Medical History:  Diagnosis Date  . Allergic rhinitis   . Hypertension    Social History   Socioeconomic History  . Marital status: Single    Spouse name: Not on file  . Number of children: Not on file  . Years of education: Not on file  . Highest education level: Not on file  Social Needs  . Financial resource strain: Patient refused  . Food insecurity - worry: Patient refused  . Food insecurity - inability: Patient refused  . Transportation needs - medical: Patient refused  . Transportation needs - non-medical: Patient refused  Occupational History  . Not on file  Tobacco Use  . Smoking status: Former Games developer  . Smokeless tobacco: Never Used  Substance and Sexual Activity  . Alcohol use: No  . Drug use: No  . Sexual activity: Not on file  Other Topics Concern  . Not on file  Social History Narrative  . Not on file   Relevant  past medical, surgical, family and social history reviewed and updated as indicated. Interim medical history since our last visit reviewed. Allergies and medications reviewed and updated.  Review of Systems  Constitutional: Positive for unexpected weight change (weight gain).  HENT: Negative.   Respiratory: Negative.   Cardiovascular: Negative.   Gastrointestinal: Negative.   Genitourinary: Negative.   Musculoskeletal: Negative.   Skin: Negative.   Neurological: Negative.   Psychiatric/Behavioral: Negative.     Per HPI unless specifically indicated above     Objective:    BP 130/82   Pulse 74   Temp 98.7 F (37.1 C)   Wt (!) 301 lb (136.5 kg)   SpO2 97%   BMI 50.09 kg/m   Wt Readings from Last 3 Encounters:  04/24/17 (!) 301 lb (136.5 kg)  08/30/16 296 lb (134.3 kg)  04/11/15 268 lb (121.6 kg)    Physical Exam  Constitutional: She is oriented to person, place, and time. No distress.  Obese  HENT:  Head: Atraumatic.  Eyes: Conjunctivae are normal. Pupils are equal, round, and reactive to light.  Neck: Normal range of motion. Neck supple.  Cardiovascular: Normal rate and normal heart sounds.  Pulmonary/Chest: Effort normal and breath sounds normal. No respiratory  distress.  Musculoskeletal: Normal range of motion.  Lymphadenopathy:    She has no cervical adenopathy.  Neurological: She is alert and oriented to person, place, and time.  Skin: Skin is warm and dry.  Psychiatric: She has a normal mood and affect. Her behavior is normal.  Nursing note and vitals reviewed.  Results for orders placed or performed in visit on 04/24/17  TSH  Result Value Ref Range   TSH 3.410 0.450 - 4.500 uIU/mL      Assessment & Plan:   Problem List Items Addressed This Visit      Other   Major depression, recurrent (HCC) - Primary    Stable and well controlled back on zoloft. Continue current regimen      Relevant Medications   sertraline (ZOLOFT) 50 MG tablet   Class 3  severe obesity due to excess calories with body mass index (BMI) of 50.0 to 59.9 in adult [facility](HCC)    Discussed with patient the importance of solid foundation of knowledge about good dietary habits and exercise regimens. Will refer to lifestyle center for good counseling on these things. Pt will start exercising regularly, reducing portions and following a plan agreed upon with dietician. Will also recheck TSH to ensure no organic causes of her inability to lose weight      Relevant Orders   Amb Referral to Nutrition and Diabetic E    Other Visit Diagnoses    Abnormal thyroid function test       Elevated TSH noted in 2016. Will recheck to make sure this isn't playing some amount of a role in her lack of weight loss success.    Relevant Orders   TSH (Completed)       Follow up plan: Return in about 3 months (around 07/25/2017) for CPE.

## 2017-04-25 LAB — TSH: TSH: 3.41 u[IU]/mL (ref 0.450–4.500)

## 2017-04-27 DIAGNOSIS — Z6841 Body Mass Index (BMI) 40.0 and over, adult: Secondary | ICD-10-CM

## 2017-04-27 NOTE — Assessment & Plan Note (Signed)
Stable and well controlled back on zoloft. Continue current regimen

## 2017-04-27 NOTE — Patient Instructions (Signed)
Follow up as needed

## 2017-04-27 NOTE — Assessment & Plan Note (Signed)
Discussed with patient the importance of solid foundation of knowledge about good dietary habits and exercise regimens. Will refer to lifestyle center for good counseling on these things. Pt will start exercising regularly, reducing portions and following a plan agreed upon with dietician. Will also recheck TSH to ensure no organic causes of her inability to lose weight

## 2017-05-10 ENCOUNTER — Ambulatory Visit: Payer: BC Managed Care – PPO | Admitting: Family Medicine

## 2017-05-10 ENCOUNTER — Encounter: Payer: Self-pay | Admitting: Family Medicine

## 2017-05-10 VITALS — BP 109/74 | HR 65 | Temp 98.4°F | Wt 298.6 lb

## 2017-05-10 DIAGNOSIS — J069 Acute upper respiratory infection, unspecified: Secondary | ICD-10-CM | POA: Diagnosis not present

## 2017-05-10 MED ORDER — AMOXICILLIN-POT CLAVULANATE 875-125 MG PO TABS: 1.0000 | ORAL_TABLET | Freq: Two times a day (BID) | ORAL | 0 refills | Status: DC

## 2017-05-10 MED ORDER — BENZONATATE 200 MG PO CAPS: 200.0000 mg | ORAL_CAPSULE | Freq: Two times a day (BID) | ORAL | 0 refills | Status: DC | PRN

## 2017-05-10 NOTE — Progress Notes (Signed)
   BP 109/74 (BP Location: Right Arm, Patient Position: Sitting, Cuff Size: Large)   Pulse 65   Temp 98.4 F (36.9 C) (Oral)   Wt 298 lb 9.6 oz (135.4 kg)   SpO2 99%   BMI 49.69 kg/m    Subjective:    Patient ID: Breanna Rosario, female    DOB: 1975/10/08, 41 y.o.   MRN: [id number]  HPI: Breanna Rosario is a 41 y.o. female  Chief Complaint  Patient presents with  . Cough    x's 1 week. Patient states she coughs so hard she'll gag. Nights the worst.   . Shortness of Breath  . Wheezing   Productive cough with wheezing and SOB x 1+ weeks. Denies fevers, chills, body aches, sore throat, congestion. Trying delsym with minimal relief. Several sick family members lately.   Relevant past medical, surgical, family and social history reviewed and updated as indicated. Interim medical history since our last visit reviewed. Allergies and medications reviewed and updated.  Review of Systems  Constitutional: Negative.   HENT: Negative.   Respiratory: Positive for cough, shortness of breath and wheezing.   Cardiovascular: Negative.   Gastrointestinal: Negative.   Genitourinary: Negative.   Musculoskeletal: Negative.   Neurological: Negative.   Psychiatric/Behavioral: Negative.    Per HPI unless specifically indicated above     Objective:    BP 109/74 (BP Location: Right Arm, Patient Position: Sitting, Cuff Size: Large)   Pulse 65   Temp 98.4 F (36.9 C) (Oral)   Wt 298 lb 9.6 oz (135.4 kg)   SpO2 99%   BMI 49.69 kg/m   Wt Readings from Last 3 Encounters:  05/10/17 298 lb 9.6 oz (135.4 kg)  04/24/17 (!) 301 lb (136.5 kg)  08/30/16 296 lb (134.3 kg)    Physical Exam  Constitutional: She is oriented to person, place, and time. She appears well-developed and well-nourished. No distress.  HENT:  Head: Atraumatic.  Right Ear: External ear normal.  Left Ear: External ear normal.  Nose: Nose normal.  Oropharynx injected   Eyes: Conjunctivae are normal. Pupils are equal,  round, and reactive to light.  Neck: Normal range of motion. Neck supple.  Cardiovascular: Normal rate and normal heart sounds.  Pulmonary/Chest: Effort normal and breath sounds normal. No respiratory distress. She has no rales.  Musculoskeletal: Normal range of motion.  Neurological: She is alert and oriented to person, place, and time.  Skin: Skin is warm and dry.  Psychiatric: She has a normal mood and affect. Her behavior is normal.  Nursing note and vitals reviewed.   Results for orders placed or performed in visit on 04/24/17  TSH  Result Value Ref Range   TSH 3.410 0.450 - 4.500 uIU/mL      Assessment & Plan:   Problem List Items Addressed This Visit    None    Visit Diagnoses    Upper respiratory tract infection, unspecified type    -  Primary   Will treat with augmentin and tessalon perles. Mucinex, delsym prn. F/u if worsening or no improvement       Follow up plan: Return if symptoms worsen or fail to improve.

## 2017-05-13 NOTE — Patient Instructions (Signed)
Follow up as needed

## 2017-05-16 ENCOUNTER — Telehealth: Payer: Self-pay | Admitting: Family Medicine

## 2017-05-16 MED ORDER — ALBUTEROL SULFATE HFA 108 (90 BASE) MCG/ACT IN AERS: 2.0000 | INHALATION_SPRAY | Freq: Four times a day (QID) | RESPIRATORY_TRACT | 1 refills | Status: DC | PRN

## 2017-05-16 NOTE — Telephone Encounter (Signed)
Copied from CRM [id number]. Topic: Quick Communication - See Telephone Encounter >> May 16, 2017 11:37 AM Diana Eves B wrote: CRM for notification. See Telephone encounter for:  Pt was given an inhaler for her cough at the office and she has misplaced the inhaler and would like to have one called In to [facility] in [place]  05/16/17.

## 2017-05-16 NOTE — Telephone Encounter (Signed)
Inhaler sent 

## 2017-08-01 ENCOUNTER — Other Ambulatory Visit: Payer: Self-pay | Admitting: Family Medicine

## 2017-10-06 ENCOUNTER — Ambulatory Visit: Payer: BC Managed Care – PPO | Admitting: Family Medicine

## 2017-10-06 ENCOUNTER — Encounter: Payer: Self-pay | Admitting: Family Medicine

## 2017-10-06 VITALS — BP 168/103 | HR 83 | Temp 98.5°F | Ht 65.0 in | Wt 307.5 lb

## 2017-10-06 DIAGNOSIS — J45909 Unspecified asthma, uncomplicated: Secondary | ICD-10-CM | POA: Insufficient documentation

## 2017-10-06 DIAGNOSIS — Z23 Encounter for immunization: Secondary | ICD-10-CM

## 2017-10-06 DIAGNOSIS — I10 Essential (primary) hypertension: Secondary | ICD-10-CM | POA: Diagnosis not present

## 2017-10-06 DIAGNOSIS — F331 Major depressive disorder, recurrent, moderate: Secondary | ICD-10-CM | POA: Diagnosis not present

## 2017-10-06 DIAGNOSIS — E782 Mixed hyperlipidemia: Secondary | ICD-10-CM | POA: Insufficient documentation

## 2017-10-06 DIAGNOSIS — Z6841 Body Mass Index (BMI) 40.0 and over, adult: Secondary | ICD-10-CM | POA: Diagnosis not present

## 2017-10-06 DIAGNOSIS — I129 Hypertensive chronic kidney disease with stage 1 through stage 4 chronic kidney disease, or unspecified chronic kidney disease: Secondary | ICD-10-CM | POA: Insufficient documentation

## 2017-10-06 LAB — UA/M W/RFLX CULTURE, ROUTINE
BILIRUBIN UA: NEGATIVE
Glucose, UA: NEGATIVE
Ketones, UA: NEGATIVE
Leukocytes, UA: NEGATIVE
Nitrite, UA: NEGATIVE
PH UA: 6 (ref 5.0–7.5)
Specific Gravity, UA: 1.02 (ref 1.005–1.030)
Urobilinogen, Ur: 0.2 mg/dL (ref 0.2–1.0)

## 2017-10-06 LAB — MICROSCOPIC EXAMINATION: Bacteria, UA: NONE SEEN

## 2017-10-06 LAB — MICROALBUMIN, URINE WAIVED
CREATININE, URINE WAIVED: 100 mg/dL (ref 10–300)
MICROALB, UR WAIVED: 150 mg/L — AB (ref 0–19)
Microalb/Creat Ratio: >300 mg/g — ABNORMAL HIGH (ref <30)

## 2017-10-06 MED ORDER — SERTRALINE HCL 100 MG PO TABS: ORAL_TABLET | ORAL | 3 refills | Status: DC

## 2017-10-06 MED ORDER — AMLODIPINE BESY-BENAZEPRIL HCL 5-10 MG PO CAPS: 1.0000 | ORAL_CAPSULE | Freq: Every day | ORAL | 1 refills | Status: DC

## 2017-10-06 MED ORDER — PREDNISONE 10 MG PO TABS
ORAL_TABLET | ORAL | 0 refills | Status: DC
Start: 2017-10-06 — End: 2018-02-07

## 2017-10-06 MED ORDER — MONTELUKAST SODIUM 10 MG PO TABS: 10.0000 mg | ORAL_TABLET | Freq: Every day | ORAL | 3 refills | Status: DC

## 2017-10-06 MED ORDER — ATORVASTATIN CALCIUM 40 MG PO TABS: 40.0000 mg | ORAL_TABLET | Freq: Every day | ORAL | 1 refills | Status: DC

## 2017-10-06 MED ORDER — METOPROLOL SUCCINATE ER 50 MG PO TB24: ORAL_TABLET | ORAL | 1 refills | Status: DC

## 2017-10-06 MED ORDER — ALBUTEROL SULFATE (2.5 MG/3ML) 0.083% IN NEBU
2.5000 mg | INHALATION_SOLUTION | Freq: Once | RESPIRATORY_TRACT | Status: AC
  Administered 2017-10-06: 2.5 mg via RESPIRATORY_TRACT

## 2017-10-06 NOTE — Patient Instructions (Signed)
Tdap Vaccine (Tetanus, Diphtheria and Pertussis): What You Need to Know 1. Why get vaccinated? Tetanus, diphtheria and pertussis are very serious diseases. Tdap vaccine can protect us from these diseases. And, Tdap vaccine given to pregnant women can protect newborn babies against pertussis. TETANUS (Lockjaw) is rare in the [place] today. It causes painful muscle tightening and stiffness, usually all over the body.  It can lead to tightening of muscles in the head and neck so you can't open your mouth, swallow, or sometimes even breathe. Tetanus kills about 1 out of 10 people who are infected even after receiving the best medical care.  DIPHTHERIA is also rare in the [place] today. It can cause a thick coating to form in the back of the throat.  It can lead to breathing problems, heart failure, paralysis, and death.  PERTUSSIS (Whooping Cough) causes severe coughing spells, which can cause difficulty breathing, vomiting and disturbed sleep.  It can also lead to weight loss, incontinence, and rib fractures. Up to 2 in 100 adolescents and 5 in 100 adults with pertussis are hospitalized or have complications, which could include pneumonia or death.  These diseases are caused by bacteria. Diphtheria and pertussis are spread from person to person through secretions from coughing or sneezing. Tetanus enters the body through cuts, scratches, or wounds. Before vaccines, as many as 200,000 cases of diphtheria, 200,000 cases of pertussis, and hundreds of cases of tetanus, were reported in the [place] each year. Since vaccination began, reports of cases for tetanus and diphtheria have dropped by about 99% and for pertussis by about 80%. 2. Tdap vaccine Tdap vaccine can protect adolescents and adults from tetanus, diphtheria, and pertussis. One dose of Tdap is routinely given at age 11 or 12. People who did not get Tdap at that age should get it as soon as possible. Tdap is especially  important for healthcare professionals and anyone having close contact with a baby younger than 12 months. Pregnant women should get a dose of Tdap during every pregnancy, to protect the newborn from pertussis. Infants are most at risk for severe, life-threatening complications from pertussis. Another vaccine, called Td, protects against tetanus and diphtheria, but not pertussis. A Td booster should be given every 10 years. Tdap may be given as one of these boosters if you have never gotten Tdap before. Tdap may also be given after a severe cut or burn to prevent tetanus infection. Your doctor or the person giving you the vaccine can give you more information. Tdap may safely be given at the same time as other vaccines. 3. Some people should not get this vaccine  A person who has ever had a life-threatening allergic reaction after a previous dose of any diphtheria, tetanus or pertussis containing vaccine, OR has a severe allergy to any part of this vaccine, should not get Tdap vaccine. Tell the person giving the vaccine about any severe allergies.  Anyone who had coma or long repeated seizures within 7 days after a childhood dose of DTP or DTaP, or a previous dose of Tdap, should not get Tdap, unless a cause other than the vaccine was found. They can still get Td.  Talk to your doctor if you: ? have seizures or another nervous system problem, ? had severe pain or swelling after any vaccine containing diphtheria, tetanus or pertussis, ? ever had a condition called Guillain-Barr Syndrome (GBS), ? aren't feeling well on the day the shot is scheduled. 4. Risks With any medicine, including   vaccines, there is a chance of side effects. These are usually mild and go away on their own. Serious reactions are also possible but are rare. Most people who get Tdap vaccine do not have any problems with it. Mild problems following Tdap: (Did not interfere with activities)  Pain where the shot was given (about  3 in 4 adolescents or 2 in 3 adults)  Redness or swelling where the shot was given (about 1 person in 5)  Mild fever of at least 100.4F (up to about 1 in 25 adolescents or 1 in 100 adults)  Headache (about 3 or 4 people in 10)  Tiredness (about 1 person in 3 or 4)  Nausea, vomiting, diarrhea, stomach ache (up to 1 in 4 adolescents or 1 in 10 adults)  Chills, sore joints (about 1 person in 10)  Body aches (about 1 person in 3 or 4)  Rash, swollen glands (uncommon)  Moderate problems following Tdap: (Interfered with activities, but did not require medical attention)  Pain where the shot was given (up to 1 in 5 or 6)  Redness or swelling where the shot was given (up to about 1 in 16 adolescents or 1 in 12 adults)  Fever over 102F (about 1 in 100 adolescents or 1 in 250 adults)  Headache (about 1 in 7 adolescents or 1 in 10 adults)  Nausea, vomiting, diarrhea, stomach ache (up to 1 or 3 people in 100)  Swelling of the entire arm where the shot was given (up to about 1 in 500).  Severe problems following Tdap: (Unable to perform usual activities; required medical attention)  Swelling, severe pain, bleeding and redness in the arm where the shot was given (rare).  Problems that could happen after any vaccine:  People sometimes faint after a medical procedure, including vaccination. Sitting or lying down for about 15 minutes can help prevent fainting, and injuries caused by a fall. Tell your doctor if you feel dizzy, or have vision changes or ringing in the ears.  Some people get severe pain in the shoulder and have difficulty moving the arm where a shot was given. This happens very rarely.  Any medication can cause a severe allergic reaction. Such reactions from a vaccine are very rare, estimated at fewer than 1 in a million doses, and would happen within a few minutes to a few hours after the vaccination. As with any medicine, there is a very remote chance of a vaccine  causing a serious injury or death. The safety of vaccines is always being monitored. For more information, visit: www.cdc.gov/vaccinesafety/ 5. What if there is a serious problem? What should I look for? Look for anything that concerns you, such as signs of a severe allergic reaction, very high fever, or unusual behavior. Signs of a severe allergic reaction can include hives, swelling of the face and throat, difficulty breathing, a fast heartbeat, dizziness, and weakness. These would usually start a few minutes to a few hours after the vaccination. What should I do?  If you think it is a severe allergic reaction or other emergency that can't wait, call 9-1-1 or get the person to the nearest hospital. Otherwise, call your doctor.  Afterward, the reaction should be reported to the Vaccine Adverse Event Reporting System (VAERS). Your doctor might file this report, or you can do it yourself through the VAERS web site at www.vaers.hhs.gov, or by calling 1-800-822-7967. ? VAERS does not give medical advice. 6. The National Vaccine Injury Compensation Program The National   Vaccine Injury Compensation Program (VICP) is a federal program that was created to compensate people who may have been injured by certain vaccines. Persons who believe they may have been injured by a vaccine can learn about the program and about filing a claim by calling 1-800-338-2382 or visiting the VICP website at www.hrsa.gov/vaccinecompensation. There is a time limit to file a claim for compensation. 7. How can I learn more?  Ask your doctor. He or she can give you the vaccine package insert or suggest other sources of information.  Call your local or state health department.  Contact the Centers for Disease Control and Prevention (CDC): ? Call 1-800-232-4636 (1-800-CDC-INFO) or ? Visit CDC's website at www.cdc.gov/vaccines CDC Tdap Vaccine VIS (08/13/13) This information is not intended to replace advice given to you by your  health care provider. Make sure you discuss any questions you have with your health care provider. Document Released: 12/06/2011 Document Revised: 02/25/2016 Document Reviewed: 02/25/2016 Elsevier Interactive Patient Education  2017 [employer]  

## 2017-10-06 NOTE — Assessment & Plan Note (Signed)
Not under good control. Not on her metoprolol. Will restart it. Recheck 1 month.

## 2017-10-06 NOTE — Assessment & Plan Note (Signed)
Better following neb. Will treat with 2 weeks of prednisone taper and singulair. Call with any concerns. Recheck lungs 1 month with spiro.

## 2017-10-06 NOTE — Assessment & Plan Note (Signed)
Encouraged slow and steady weight loss of 1-2lbs a week.

## 2017-10-06 NOTE — Progress Notes (Signed)
BP (!) 168/103 (BP Location: Left Arm, Patient Position: Sitting, Cuff Size: Normal)   Pulse 83   Temp 98.5 F (36.9 C)   Ht 5\' 5"  (1.651 m)   Wt (!) 307 lb 8 oz (139.5 kg)   SpO2 98%   BMI 51.17 kg/m    Subjective:    Patient ID: Breanna Rosario, female    DOB: 05-14-1976, 42 y.o.   MRN: [id number]  HPI: Breanna Rosario is a 42 y.o. female  Chief Complaint  Patient presents with  . Depression  . Allergies   ALLERGIES Duration: The last 2 weeks Runny nose: no  Nasal congestion: no Nasal itching: no Sneezing: no Eye swelling, itching or discharge: no Post nasal drip: no Cough: yes, productive Sinus pressure: no  Ear pain: no  Ear pressure: no  Fever: no  Symptoms occur seasonally: yes Symptoms occur perenially: no Satisfied with current treatment: no Allergist evaluation in past: no Allergen injection immunotherapy: no Recurrent sinus infections: no ENT evaluation in past: no Known environmental allergy: yes History of asthma: no Current allergy medications: flonase Treatments attempted: flonase  DEPRESSION Mood status: uncontrolled Satisfied with current treatment?: no Symptom severity: mild  Duration of current treatment : chronic Side effects: no Medication compliance: excellent compliance Psychotherapy/counseling: no  Previous psychiatric medications: sertraline Depressed mood: yes Anxious mood: yes Anhedonia: no Significant weight loss or gain: no Insomnia: no  Fatigue: yes Feelings of worthlessness or guilt: yes Impaired concentration/indecisiveness: yes Suicidal ideations: no Hopelessness: yes Crying spells: yes Depression screen [facility] 2/9 10/06/2017 04/24/2017 08/30/2016  Decreased Interest 1 1 1   Down, Depressed, Hopeless 1 2 2   PHQ - 2 Score 2 3 3   Altered sleeping 2 2 1   Tired, decreased energy 2 2 1   Change in appetite 2 2 2   Feeling bad or failure about yourself  2 1 1   Trouble concentrating 2 0 0  Moving slowly or  fidgety/restless 2 1 1   Suicidal thoughts 0 0 0  PHQ-9 Score 14 11 9    HYPERTENSION / HYPERLIPIDEMIA Satisfied with current treatment? yes Duration of hypertension: chronic BP monitoring frequency: not checking BP range:  BP medication side effects: no Past BP meds: metoprolol Duration of hyperlipidemia: chronic Cholesterol medication side effects: no Cholesterol supplements: none Past cholesterol medications: atrovastatin Medication compliance: excellent compliance Aspirin: yes Recent stressors: yes Recurrent headaches: no Visual changes: no Palpitations: no Dyspnea: no Chest pain: no Lower extremity edema: no Dizzy/lightheaded: no   Relevant past medical, surgical, family and social history reviewed and updated as indicated. Interim medical history since our last visit reviewed. Allergies and medications reviewed and updated.  Review of Systems  Constitutional: Positive for fatigue. Negative for activity change, appetite change, chills, diaphoresis, fever and unexpected weight change.  HENT: Negative for congestion, dental problem, drooling, ear discharge, ear pain, facial swelling, hearing loss, mouth sores, nosebleeds, postnasal drip, rhinorrhea, sinus pressure, sinus pain, sneezing, sore throat, tinnitus, trouble swallowing and voice change.   Eyes: Negative.   Respiratory: Positive for cough, chest tightness, shortness of breath and wheezing. Negative for apnea, choking and stridor.   Cardiovascular: Negative.   Genitourinary: Negative.   Neurological: Negative.   Psychiatric/Behavioral: Negative.     Per HPI unless specifically indicated above     Objective:    BP (!) 168/103 (BP Location: Left Arm, Patient Position: Sitting, Cuff Size: Normal)   Pulse 83   Temp 98.5 F (36.9 C)   Ht 5\' 5"  (1.651 m)   Wt Marland Kitchen)  307 lb 8 oz (139.5 kg)   SpO2 98%   BMI 51.17 kg/m   Wt Readings from Last 3 Encounters:  10/06/17 (!) 307 lb 8 oz (139.5 kg)  05/10/17 298 lb 9.6  oz (135.4 kg)  04/24/17 (!) 301 lb (136.5 kg)    Physical Exam  Constitutional: She is oriented to person, place, and time. She appears well-developed and well-nourished. No distress.  HENT:  Head: Normocephalic and atraumatic.  Right Ear: Hearing and external ear normal.  Left Ear: Hearing and external ear normal.  Nose: Nose normal.  Mouth/Throat: Oropharynx is clear and moist. No oropharyngeal exudate.  Eyes: Pupils are equal, round, and reactive to light. Conjunctivae, EOM and lids are normal. Right eye exhibits no discharge. Left eye exhibits no discharge. No scleral icterus.  Neck: Normal range of motion. Neck supple. No JVD present. No tracheal deviation present. No thyromegaly present.  Cardiovascular: Normal rate, regular rhythm, normal heart sounds and intact distal pulses. Exam reveals no gallop and no friction rub.  No murmur heard. Pulmonary/Chest: Effort normal. No stridor. No respiratory distress. She has wheezes. She has no rales. She exhibits no tenderness.  Musculoskeletal: Normal range of motion.  Lymphadenopathy:    She has no cervical adenopathy.  Neurological: She is alert and oriented to person, place, and time.  Skin: Skin is warm and intact. Capillary refill takes less than 2 seconds. No rash noted. She is not diaphoretic. No erythema. No pallor.  Psychiatric: She has a normal mood and affect. Her speech is normal and behavior is normal. Judgment and thought content normal. Cognition and memory are normal.  Nursing note and vitals reviewed.   Results for orders placed or performed in visit on 04/24/17  TSH  Result Value Ref Range   TSH 3.410 0.450 - 4.500 uIU/mL      Assessment & Plan:   Problem List Items Addressed This Visit      Cardiovascular and Mediastinum   HTN (hypertension) - Primary    Not under good control. Not on her metoprolol. Will restart it. Recheck 1 month.       Relevant Medications   amLODipine-benazepril (LOTREL) 5-10 MG capsule     atorvastatin (LIPITOR) 40 MG tablet   metoprolol succinate (TOPROL-XL) 50 MG 24 hr tablet   Other Relevant Orders   CBC with Differential/Platelet   Comprehensive metabolic panel   Microalbumin, Urine Waived   UA/M w/rflx Culture, Routine     Respiratory   Asthma due to environmental allergies    Better following neb. Will treat with 2 weeks of prednisone taper and singulair. Call with any concerns. Recheck lungs 1 month with spiro.       Relevant Medications   albuterol (PROVENTIL) (2.5 MG/3ML) 0.083% nebulizer solution 2.5 mg (Completed)   predniSONE (DELTASONE) 10 MG tablet   montelukast (SINGULAIR) 10 MG tablet     Other   Major depression, recurrent (HCC)    Not under good control. Will increase her sertraline to 100mg  and recheck 1 month. Call with any concerns.       Relevant Medications   sertraline (ZOLOFT) 100 MG tablet   Other Relevant Orders   CBC with Differential/Platelet   Comprehensive metabolic panel   UA/M w/rflx Culture, Routine   Class 3 severe obesity due to excess calories with body mass index (BMI) of 50.0 to 59.9 in adult [facility])    Encouraged slow and steady weight loss of 1-2lbs a week.       Mixed  hyperlipidemia    Rechecking levels today. Refill given. Call with any concerns.       Relevant Medications   amLODipine-benazepril (LOTREL) 5-10 MG capsule   atorvastatin (LIPITOR) 40 MG tablet   metoprolol succinate (TOPROL-XL) 50 MG 24 hr tablet   Other Relevant Orders   CBC with Differential/Platelet   Comprehensive metabolic panel   Lipid Panel w/o Chol/HDL Ratio   UA/M w/rflx Culture, Routine    Other Visit Diagnoses    Immunization due       Tdap given today.   Relevant Orders   Tdap vaccine greater than or equal to 7yo IM (Completed)   CBC with Differential/Platelet   Comprehensive metabolic panel   UA/M w/rflx Culture, Routine       Follow up plan: Return in about 1 month (around 11/03/2017) for follow up mood and BP and  breathing with spiro.

## 2017-10-06 NOTE — Assessment & Plan Note (Signed)
Not under good control. Will increase her sertraline to 100mg and recheck 1 month. Call with any concerns.  

## 2017-10-06 NOTE — Assessment & Plan Note (Signed)
Rechecking levels today. Refill given. Call with any concerns.

## 2017-10-07 LAB — CBC WITH DIFFERENTIAL/PLATELET
BASOS ABS: 0 10*3/uL (ref 0.0–0.2)
Basos: 0 %
EOS (ABSOLUTE): 0.1 10*3/uL (ref 0.0–0.4)
Eos: 2 %
Hematocrit: 38.2 % (ref 34.0–46.6)
Hemoglobin: 13.4 g/dL (ref 11.1–15.9)
IMMATURE GRANULOCYTES: 1 %
Immature Grans (Abs): 0.1 10*3/uL (ref 0.0–0.1)
Lymphocytes Absolute: 2.8 10*3/uL (ref 0.7–3.1)
Lymphs: 29 %
MCH: 30.5 pg (ref 26.6–33.0)
MCHC: 35.1 g/dL (ref 31.5–35.7)
MCV: 87 fL (ref 79–97)
MONOS ABS: 0.7 10*3/uL (ref 0.1–0.9)
Monocytes: 7 %
NEUTROS PCT: 61 %
Neutrophils Absolute: 5.9 10*3/uL (ref 1.4–7.0)
PLATELETS: 211 10*3/uL (ref 150–379)
RBC: 4.4 x10E6/uL (ref 3.77–5.28)
RDW: 14.8 % (ref 12.3–15.4)
WBC: 9.5 10*3/uL (ref 3.4–10.8)

## 2017-10-07 LAB — LIPID PANEL W/O CHOL/HDL RATIO
CHOLESTEROL TOTAL: 208 mg/dL — AB (ref 100–199)
HDL: 38 mg/dL — AB (ref >39)
TRIGLYCERIDES: 412 mg/dL — AB (ref 0–149)

## 2017-10-07 LAB — COMPREHENSIVE METABOLIC PANEL
ALK PHOS: 76 IU/L (ref 39–117)
ALT: 20 IU/L (ref 0–32)
AST: 27 IU/L (ref 0–40)
Albumin/Globulin Ratio: 1.2 (ref 1.2–2.2)
Albumin: 2.9 g/dL — ABNORMAL LOW (ref 3.5–5.5)
BUN/Creatinine Ratio: 22 (ref 9–23)
BUN: 15 mg/dL (ref 6–24)
Bilirubin Total: <0.2 mg/dL (ref 0.0–1.2)
CALCIUM: 8.5 mg/dL — AB (ref 8.7–10.2)
CO2: 23 mmol/L (ref 20–29)
CREATININE: 0.69 mg/dL (ref 0.57–1.00)
Chloride: 105 mmol/L (ref 96–106)
GFR calc Af Amer: 124 mL/min/{1.73_m2} (ref >59)
GFR, EST NON AFRICAN AMERICAN: 108 mL/min/{1.73_m2} (ref >59)
Globulin, Total: 2.5 g/dL (ref 1.5–4.5)
Glucose: 84 mg/dL (ref 65–99)
POTASSIUM: 4.2 mmol/L (ref 3.5–5.2)
Sodium: 142 mmol/L (ref 134–144)
Total Protein: 5.4 g/dL — ABNORMAL LOW (ref 6.0–8.5)

## 2018-02-07 ENCOUNTER — Encounter: Payer: Self-pay | Admitting: Physician Assistant

## 2018-02-07 ENCOUNTER — Ambulatory Visit (INDEPENDENT_AMBULATORY_CARE_PROVIDER_SITE_OTHER): Payer: BC Managed Care – PPO | Admitting: Physician Assistant

## 2018-02-07 ENCOUNTER — Other Ambulatory Visit: Payer: Self-pay

## 2018-02-07 VITALS — BP 106/71 | HR 78 | Temp 98.3°F | Ht 65.0 in | Wt 305.0 lb

## 2018-02-07 DIAGNOSIS — F331 Major depressive disorder, recurrent, moderate: Secondary | ICD-10-CM | POA: Diagnosis not present

## 2018-02-07 DIAGNOSIS — J45909 Unspecified asthma, uncomplicated: Secondary | ICD-10-CM | POA: Diagnosis not present

## 2018-02-07 DIAGNOSIS — N912 Amenorrhea, unspecified: Secondary | ICD-10-CM

## 2018-02-07 MED ORDER — FLUTICASONE FUROATE-VILANTEROL 100-25 MCG/INH IN AEPB: 1.0000 | INHALATION_SPRAY | Freq: Every day | RESPIRATORY_TRACT | 0 refills | Status: DC

## 2018-02-07 MED ORDER — PREDNISONE 20 MG PO TABS: 20.0000 mg | ORAL_TABLET | Freq: Every day | ORAL | 0 refills | Status: AC

## 2018-02-07 MED ORDER — SERTRALINE HCL 100 MG PO TABS
ORAL_TABLET | ORAL | 0 refills | Status: DC
Start: 2018-02-07 — End: 2018-05-18

## 2018-02-07 MED ORDER — MONTELUKAST SODIUM 10 MG PO TABS: 10.0000 mg | ORAL_TABLET | Freq: Every day | ORAL | 0 refills | Status: DC

## 2018-02-07 NOTE — Progress Notes (Signed)
Subjective:    Patient ID: Breanna Rosario, female    DOB: [birth year], 42 y.o.   MRN: [id number]  Breanna Rosario is a 42 y.o. female presenting on 02/07/2018 for Cough (x 2 days/pt states while she was taking montelukas she was feeling better but she run out of this med and the cough got back/ ) and Medication Refill (zoloft, montelukas)   HPI   Reports cough and wheezing x 2 days. Says she typically has symptoms like these in allergy season and winter. No fevers, chills, productive cough. Has albuterol inhaler that she is using. Previous on singulair but ran out.  Was seen previously in this office with similar symptoms. Diagnosed with asthma and was supposed to come back for spirometry but never did.  Also reports she needs a refill on her zoloft for depression which she says she is doing well with.   Also reports she thinks she is perimenopausal. She has not had period in months and is not sexually active. Reports she has night sweats and mother went through menopause at this age.   Social History   Tobacco Use  . Smoking status: Former Games developer  . Smokeless tobacco: Never Used  Substance Use Topics  . Alcohol use: No  . Drug use: No    Review of Systems Per HPI unless specifically indicated above     Objective:    BP 106/71   Pulse 78   Temp 98.3 F (36.8 C) (Oral)   Ht 5\' 5"  (1.651 m)   Wt (!) 305 lb (138.3 kg)   SpO2 99%   BMI 50.75 kg/m   Wt Readings from Last 3 Encounters:  02/07/18 (!) 305 lb (138.3 kg)  10/06/17 (!) 307 lb 8 oz (139.5 kg)  05/10/17 298 lb 9.6 oz (135.4 kg)    Physical Exam  Constitutional: She is oriented to person, place, and time. She appears well-developed and well-nourished.  Cardiovascular: Normal rate and regular rhythm.  Pulmonary/Chest: Effort normal and breath sounds normal.  Neurological: She is alert and oriented to person, place, and time.  Skin: Skin is warm and dry.  Psychiatric: She has a normal mood and affect. Her  behavior is normal.     Office Visit from 02/07/2018 in [facility] Total Score  11      Results for orders placed or performed in visit on 10/06/17  Microscopic Examination  Result Value Ref Range   WBC, UA 0-5 0 - 5 /hpf   RBC, UA 0-2 0 - 2 /hpf   Epithelial Cells (non renal) 0-10 0 - 10 /hpf   Casts Present None seen /lpf   Cast Type White cell casts (A) N/A   Bacteria, UA None seen None seen/Few  CBC with Differential/Platelet  Result Value Ref Range   WBC 9.5 3.4 - 10.8 x10E3/uL   RBC 4.40 3.77 - 5.28 x10E6/uL   Hemoglobin 13.4 11.1 - 15.9 g/dL   Hematocrit 40.9 81.1 - 46.6 %   MCV 87 79 - 97 fL   MCH 30.5 26.6 - 33.0 pg   MCHC 35.1 31.5 - 35.7 g/dL   RDW 91.4 78.2 - 95.6 %   Platelets 211 150 - 379 x10E3/uL   Neutrophils 61 Not Estab. %   Lymphs 29 Not Estab. %   Monocytes 7 Not Estab. %   Eos 2 Not Estab. %   Basos 0 Not Estab. %   Neutrophils Absolute 5.9 1.4 - 7.0 x10E3/uL  Lymphocytes Absolute 2.8 0.7 - 3.1 x10E3/uL   Monocytes Absolute 0.7 0.1 - 0.9 x10E3/uL   EOS (ABSOLUTE) 0.1 0.0 - 0.4 x10E3/uL   Basophils Absolute 0.0 0.0 - 0.2 x10E3/uL   Immature Granulocytes 1 Not Estab. %   Immature Grans (Abs) 0.1 0.0 - 0.1 x10E3/uL  Comprehensive metabolic panel  Result Value Ref Range   Glucose 84 65 - 99 mg/dL   BUN 15 6 - 24 mg/dL   Creatinine, Ser 8.29 0.57 - 1.00 mg/dL   GFR calc non Af Amer 108 >59 mL/min/1.73   GFR calc Af Amer 124 >59 mL/min/1.73   BUN/Creatinine Ratio 22 9 - 23   Sodium 142 134 - 144 mmol/L   Potassium 4.2 3.5 - 5.2 mmol/L   Chloride 105 96 - 106 mmol/L   CO2 23 20 - 29 mmol/L   Calcium 8.5 (L) 8.7 - 10.2 mg/dL   Total Protein 5.4 (L) 6.0 - 8.5 g/dL   Albumin 2.9 (L) 3.5 - 5.5 g/dL   Globulin, Total 2.5 1.5 - 4.5 g/dL   Albumin/Globulin Ratio 1.2 1.2 - 2.2   Bilirubin Total <0.2 0.0 - 1.2 mg/dL   Alkaline Phosphatase 76 39 - 117 IU/L   AST 27 0 - 40 IU/L   ALT 20 0 - 32 IU/L  Microalbumin, Urine Waived    Result Value Ref Range   Microalb, Ur Waived 150 (H) 0 - 19 mg/L   Creatinine, Urine Waived 100 10 - 300 mg/dL   Microalb/Creat Ratio >300 (H) <30 mg/g  Lipid Panel w/o Chol/HDL Ratio  Result Value Ref Range   Cholesterol, Total 208 (H) 100 - 199 mg/dL   Triglycerides 562 (H) 0 - 149 mg/dL   HDL 38 (L) >13 mg/dL   VLDL Cholesterol Cal Comment 5 - 40 mg/dL   LDL Calculated Comment 0 - 99 mg/dL  UA/M w/rflx Culture, Routine  Result Value Ref Range   Specific Gravity, UA 1.020 1.005 - 1.030   pH, UA 6.0 5.0 - 7.5   Color, UA Yellow Yellow   Appearance Ur Cloudy (A) Clear   Leukocytes, UA Negative Negative   Protein, UA 3+ (A) Negative/Trace   Glucose, UA Negative Negative   Ketones, UA Negative Negative   RBC, UA Trace (A) Negative   Bilirubin, UA Negative Negative   Urobilinogen, Ur 0.2 0.2 - 1.0 mg/dL   Nitrite, UA Negative Negative   Microscopic Examination See below:       Assessment & Plan:  1. Asthma due to environmental allergies  Very mild wheezing if any at all. Prednisone as below. Did not come back for spirometry and no time within office visit today. Uncontrolled asthma, will do trial of Breo. Follow up with regular provider.  - montelukast (SINGULAIR) 10 MG tablet; Take 1 tablet (10 mg total) by mouth at bedtime.  Dispense: 90 tablet; Refill: 0 - predniSONE (DELTASONE) 20 MG tablet; Take 1 tablet (20 mg total) by mouth daily with breakfast for 5 days.  Dispense: 5 tablet; Refill: 0 - fluticasone furoate-vilanterol (BREO ELLIPTA) 100-25 MCG/INH AEPB; Inhale 1 puff into the lungs daily.  Dispense: 28 each; Refill: 0  2. Moderate episode of recurrent major depressive disorder (HCC)  - sertraline (ZOLOFT) 100 MG tablet; TAKE 1 TABLET(50 MG) BY MOUTH DAILY  Dispense: 90 tablet; Refill: 0  3. Amenorrhea  Follow up with PCP.  Follow up plan: Return in about 2 weeks (around 02/21/2018) for Dr Laural Benes asthma, premenopause .  Osvaldo Angst, PA-C  [facility] 02/08/2018, 3:15 PM

## 2018-02-08 NOTE — Patient Instructions (Signed)

## 2018-03-14 ENCOUNTER — Other Ambulatory Visit: Payer: Self-pay | Admitting: Physician Assistant

## 2018-03-14 DIAGNOSIS — J45909 Unspecified asthma, uncomplicated: Secondary | ICD-10-CM

## 2018-05-18 ENCOUNTER — Other Ambulatory Visit: Payer: Self-pay | Admitting: Family Medicine

## 2018-05-18 ENCOUNTER — Other Ambulatory Visit: Payer: Self-pay | Admitting: Physician Assistant

## 2018-05-18 DIAGNOSIS — J45909 Unspecified asthma, uncomplicated: Secondary | ICD-10-CM

## 2018-05-18 DIAGNOSIS — F331 Major depressive disorder, recurrent, moderate: Secondary | ICD-10-CM

## 2018-05-18 NOTE — Telephone Encounter (Signed)
Requested medication (s) are due for refill today: yes  Requested medication (s) are on the active medication list: yes  Last refill:  10/06/17 #90 tabs 1 RF  Future visit scheduled: no  Notes to clinic:  Called pt to make appointment- Mailbox is full. Unable to leave message. Routing back to office.   Requested Prescriptions  Pending Prescriptions Disp Refills   metoprolol succinate (TOPROL-XL) 50 MG 24 hr tablet [Pharmacy Med Name: METOPROLOL ER SUCCINATE 50MG  TABS] 90 tablet 0    Sig: TAKE 1 TABLET BY MOUTH EVERY DAY     Cardiovascular:  Beta Blockers Passed - 05/18/2018  1:42 PM      Passed - Last BP in normal range    BP Readings from Last 1 Encounters:  02/07/18 106/71         Passed - Last Heart Rate in normal range    Pulse Readings from Last 1 Encounters:  02/07/18 78         Passed - Valid encounter within last 6 months    Recent Outpatient Visits          3 months ago Asthma due to environmental allergies   [facility] Trey Sailors, PA-C   7 months ago Essential hypertension   [facility] [place], [place], [place]   1 year ago Upper respiratory tract infection, unspecified type   [facility] Particia Nearing, [place]   1 year ago Moderate episode of recurrent major depressive disorder [facility])   [facility] Particia Nearing, [place]   1 year ago Upper respiratory infection, viral   [facility] [place], [place], [place]

## 2018-06-02 ENCOUNTER — Other Ambulatory Visit: Payer: Self-pay | Admitting: Family Medicine

## 2018-07-31 ENCOUNTER — Encounter: Payer: Self-pay | Admitting: Obstetrics and Gynecology

## 2018-07-31 ENCOUNTER — Ambulatory Visit (INDEPENDENT_AMBULATORY_CARE_PROVIDER_SITE_OTHER): Payer: BC Managed Care – PPO | Admitting: Obstetrics and Gynecology

## 2018-07-31 ENCOUNTER — Other Ambulatory Visit (HOSPITAL_COMMUNITY)
Admission: RE | Admit: 2018-07-31 | Discharge: 2018-07-31 | Disposition: A | Discharge status: Home / Self Care | Payer: BC Managed Care – PPO | Source: Ambulatory Visit | Attending: Obstetrics and Gynecology | Admitting: Obstetrics and Gynecology

## 2018-07-31 VITALS — BP 148/100 | HR 88 | Ht 65.0 in | Wt 306.0 lb

## 2018-07-31 DIAGNOSIS — Z01419 Encounter for gynecological examination (general) (routine) without abnormal findings: Secondary | ICD-10-CM

## 2018-07-31 DIAGNOSIS — Z1239 Encounter for other screening for malignant neoplasm of breast: Secondary | ICD-10-CM

## 2018-07-31 DIAGNOSIS — Z113 Encounter for screening for infections with a predominantly sexual mode of transmission: Secondary | ICD-10-CM

## 2018-07-31 DIAGNOSIS — N926 Irregular menstruation, unspecified: Secondary | ICD-10-CM

## 2018-07-31 DIAGNOSIS — Z124 Encounter for screening for malignant neoplasm of cervix: Secondary | ICD-10-CM | POA: Insufficient documentation

## 2018-07-31 DIAGNOSIS — E28319 Asymptomatic premature menopause: Secondary | ICD-10-CM

## 2018-07-31 DIAGNOSIS — Z0000 Encounter for general adult medical examination without abnormal findings: Secondary | ICD-10-CM

## 2018-07-31 DIAGNOSIS — Z1329 Encounter for screening for other suspected endocrine disorder: Secondary | ICD-10-CM

## 2018-07-31 NOTE — Progress Notes (Signed)
Gynecology Annual Exam  PCP: Particia Nearing, PA-C  Chief Complaint:  Chief Complaint  Patient presents with  . Gynecologic Exam    Night sweat, thinning hair, light periods only 3 last year, and hot flashes and wants STD testing     History of Present Illness: Patient is a 43 y.o. G1P1001 presents for annual exam. The patient has no complaints today.   She has complaints today of early menopause with hotflashes, irritability, sleep disturbances, and vaginal dryness.  She has noticed thinning of her hair which is distressing, no balding.   She reports that in 2019 sshe had three bleeding events all of which lasted only 1-2 days and there was only a small amount of blood Her last bleeding event was Decemeber 2019.   She has a history of regular monthly periods. Normal duration 3-4 days. No heavy bleeding, mild cramps.   She last used and IUD 5-6 years ago. It was in place for 5 years. She reports she took and OCP for 6 years when she was younger.   Menarche: 11 LMP: No LMP recorded. (Menstrual status: Irregular Periods). Average Interval: regular, 28 days Duration of flow: 4 days Heavy Menses: no Clots: no Intermenstrual Bleeding: no Postcoital Bleeding: yes Dysmenorrhea: no  The patient is sexually active. She currently uses none for contraception. She denies dyspareunia.  The patient does perform self breast exams.  There is no notable family history of breast or ovarian cancer in her family.  The patient wears seatbelts: yes.   The patient has regular exercise: yes.   She reports exercising 2-3 times a wekk. She is eating a healthy diet with fruits and vegetables and whole grains.   The patient denies current symptoms of depression.    Review of Systems: Review of Systems  Constitutional: Negative for chills, fever, malaise/fatigue and weight loss.  HENT: Negative for congestion, hearing loss and sinus pain.   Eyes: Negative for blurred vision and double  vision.  Respiratory: Negative for cough, sputum production, shortness of breath and wheezing.   Cardiovascular: Negative for chest pain, palpitations, orthopnea and leg swelling.  Gastrointestinal: Negative for abdominal pain, constipation, diarrhea, nausea and vomiting.  Genitourinary: Negative for dysuria, flank pain, frequency, hematuria and urgency.  Musculoskeletal: Negative for back pain, falls and joint pain.  Skin: Negative for itching and rash.  Neurological: Negative for dizziness and headaches.  Psychiatric/Behavioral: Negative for depression, substance abuse and suicidal ideas. The patient is not nervous/anxious.     Past Medical History:  Past Medical History:  Diagnosis Date  . Allergic rhinitis   . Heart attack (HCC) 03/2015  . Hypertension     Past Surgical History:  Past Surgical History:  Procedure Laterality Date  . CARDIAC CATHETERIZATION Right 03/17/2015   Procedure: Left Heart Cath and Coronary Angiography;  Surgeon: Laurier Nancy, MD;  Location: [facility] INVASIVE CV LAB;  Service: Cardiovascular;  Laterality: Right;  . CARDIAC CATHETERIZATION N/A 03/18/2015   Procedure: Coronary Stent Intervention;  Surgeon: Alwyn Pea, MD;  Location: [facility] INVASIVE CV LAB;  Service: Cardiovascular;  Laterality: N/A;  . None      Gynecologic History:  No LMP recorded. (Menstrual status: Irregular Periods). Contraception: none Last Pap: Results were: unkown  Last mammogram: 2007 [facility] 1  Obstetric History: G1P1001  Family History:  Family History  Problem Relation Age of Onset  . Coronary artery disease Father        Died at age 32; four-vessel bypass at 43  years of age  . Hypertension Father   . Arthritis Mother   . Cancer Mother   . Migraines Mother     Social History:  Social History   Socioeconomic History  . Marital status: Single    Spouse name: Not on file  . Number of children: Not on file  . Years of education: Not on file  . Highest education  level: Not on file  Occupational History  . Not on file  Social Needs  . Financial resource strain: Patient refused  . Food insecurity:    Worry: Patient refused    Inability: Patient refused  . Transportation needs:    Medical: Patient refused    Non-medical: Patient refused  Tobacco Use  . Smoking status: Former Games developer  . Smokeless tobacco: Never Used  Substance and Sexual Activity  . Alcohol use: No  . Drug use: No  . Sexual activity: Yes    Birth control/protection: None  Lifestyle  . Physical activity:    Days per week: Patient refused    Minutes per session: Patient refused  . Stress: Not on file  Relationships  . Social connections:    Talks on phone: Patient refused    Gets together: Patient refused    Attends religious service: Patient refused    Active member of club or organization: Patient refused    Attends meetings of clubs or organizations: Patient refused    Relationship status: Patient refused  . Intimate partner violence:    Fear of current or ex partner: Patient refused    Emotionally abused: Patient refused    Physically abused: Patient refused    Forced sexual activity: Patient refused  Other Topics Concern  . Not on file  Social History Narrative  . Not on file    Allergies:  No Known Allergies  Medications: Prior to Admission medications   Medication Sig Start Date End Date Taking? Authorizing Provider  amLODipine-benazepril (LOTREL) 5-10 MG capsule TAKE 1 CAPSULE BY MOUTH DAILY 06/04/18  Yes Particia Nearing, PA-C  aspirin EC 81 MG tablet Take 81 mg by mouth daily.   Yes [provider]  atorvastatin (LIPITOR) 80 MG tablet TK 1 T PO ONCE D 02/16/18  Yes [provider]  clopidogrel (PLAVIX) 75 MG tablet Take 1 tablet (75 mg total) by mouth daily with breakfast. 03/19/15  Yes Sainani, Rolly Pancake, MD  metoprolol succinate (TOPROL-XL) 50 MG 24 hr tablet TAKE 1 TABLET BY MOUTH EVERY DAY 05/21/18  Yes Particia Nearing,  PA-C  montelukast (SINGULAIR) 10 MG tablet TAKE 1 TABLET(10 MG) BY MOUTH AT BEDTIME 05/21/18  Yes Particia Nearing, PA-C  sertraline (ZOLOFT) 100 MG tablet TAKE 1 TABLET BY MOUTH DAILY 05/21/18  Yes Particia Nearing, PA-C  BREO ELLIPTA 100-25 MCG/INH AEPB INHALE 1 PUFF INTO THE LUNGS DAILY Patient not taking: Reported on 07/31/2018 03/15/18   Particia Nearing, PA-C    Physical Exam Vitals: Blood pressure (!) 148/100, pulse 88, height 5\' 5"  (1.651 m), weight (!) 306 lb (138.8 kg).  General: NAD HEENT: normocephalic, anicteric Thyroid: no enlargement, no palpable nodules Pulmonary: No increased work of breathing, CTAB Cardiovascular: RRR, distal pulses 2+ Breast: Breast symmetrical, no tenderness, no palpable nodules or masses, no skin or nipple retraction present, no nipple discharge.  No axillary or supraclavicular lymphadenopathy. Abdomen: NABS, soft, non-tender, non-distended.  Umbilicus without lesions.  No hepatomegaly, splenomegaly or masses palpable. No evidence of hernia  Genitourinary:  External: Normal external female genitalia.  Normal urethral meatus, normal Bartholin's and Skene's glands.    Vagina: Normal vaginal mucosa, no evidence of prolapse.    Cervix: Grossly normal in appearance, no bleeding  Uterus: Non-enlarged, mobile, normal contour.  No CMT  Adnexa: ovaries non-enlarged, no adnexal masses  Rectal: deferred  Lymphatic: no evidence of inguinal lymphadenopathy Extremities: no edema, erythema, or tenderness Neurologic: Grossly intact Psychiatric: mood appropriate, affect full  Female chaperone present for pelvic and breast  portions of the physical exam    Assessment: 43 y.o. G1P1001 routine annual exam  Plan: Problem List Items Addressed This Visit    None    Visit Diagnoses    Healthcare maintenance    -  Primary   Screening for breast cancer       Relevant Orders   MM DIGITAL SCREENING BILATERAL   Screening for cervical cancer        Relevant Orders   Cytology - PAP   Screening for thyroid disorder       Relevant Orders   Thyroid Panel With TSH   Screening examination for STD (sexually transmitted disease)       Relevant Orders   HIV Antibody (routine testing w rflx)   RPR   Hepatitis panel, acute   NuSwab Vaginitis Plus (VG+)   Early menopause       Relevant Orders   FSH   Estradiol   Irregular uterine bleeding       Relevant Orders   US PELVIS TRANSVANGINAL NON-OB (TV ONLY)      1) Mammogram - recommend yearly screening mammogram.  Mammogram Was ordered today   2) STI screening  was offered and accepted  3) ASCCP guidelines and rational discussed.  Patient opts for every 5 years screening interval  4) Contraception - the patient is currently using  none.  She is happy with her current form of contraception and plans to continue  5) Colonoscopy -- Screening recommended starting at age 13 for average risk individuals, age 34 for individuals deemed at increased risk (including African Americans) and recommended to continue until age 76.  For patient age 8-85 individualized approach is recommended.  Gold standard screening is via colonoscopy, Cologuard screening is an acceptable alternative for patient unwilling or unable to undergo colonoscopy.  "Colorectal cancer screening for average?risk adults: 2018 guideline update from the American Cancer Society"[place]: A Cancer Journal for Clinicians: Nov 16, 2016   6) Routine healthcare maintenance including cholesterol, diabetes screening discussed managed by PCP  7) Return in about 1 week (around 08/07/2018) for GYN visit and [place].  9) Discussed cardiovascular risks. She has a cardiologist and plans to follow up with him for cardiac stress testing. ASCVD risk calculator showed 2.5% risk in next 10 years. 39% lifetime risks. Encouraged patient to follow up with PCP about better blood pressure control and statin therapy.   10) Concerns for early menopause. Patient  particularly distress by hair loss. Labs ordered for TSH. FSH. Estradiol.   11) Irregular uterine bleeding, possibly postmenopausal bleeding. Will perform [place]. Return visit in 1 week.   Adelene Idler MD [facility], [facility] 07/31/2018 6:03 PM

## 2018-07-31 NOTE — Patient Instructions (Signed)
Institute of Medicine Recommended Dietary Allowances for Calcium and Vitamin D  Age (yr) Calcium Recommended Dietary Allowance (mg/day) Vitamin D Recommended Dietary Allowance (international units/day)  9-18 1,300 600  19-50 1,000 600  51-70 1,200 600  71 and older 1,200 800  Data from Institute of Medicine. Dietary reference intakes: calcium, vitamin D. [place], [place]: National Academies Press; 2011.    Exercising to Stay Healthy To become healthy and stay healthy, it is recommended that you do moderate-intensity and vigorous-intensity exercise. You can tell that you are exercising at a moderate intensity if your heart starts beating faster and you start breathing faster but can still hold a conversation. You can tell that you are exercising at a vigorous intensity if you are breathing much harder and faster and cannot hold a conversation while exercising. Exercising regularly is important. It has many health benefits, such as:  Improving overall fitness, flexibility, and endurance.  Increasing bone density.  Helping with weight control.  Decreasing body fat.  Increasing muscle strength.  Reducing stress and tension.  Improving overall health. How often should I exercise? Choose an activity that you enjoy, and set realistic goals. Your health care provider can help you make an activity plan that works for you. Exercise regularly as told by your health care provider. This may include:  Doing strength training two times a week, such as: ? Lifting weights. ? Using resistance bands. ? Push-ups. ? Sit-ups. ? Yoga.  Doing a certain intensity of exercise for a given amount of time. Choose from these options: ? A total of 150 minutes of moderate-intensity exercise every week. ? A total of 75 minutes of vigorous-intensity exercise every week. ? A mix of moderate-intensity and vigorous-intensity exercise every week. Children, pregnant women, people who have not exercised  regularly, people who are overweight, and older adults may need to talk with a health care provider about what activities are safe to do. If you have a medical condition, be sure to talk with your health care provider before you start a new exercise program. What are some exercise ideas? Moderate-intensity exercise ideas include:  Walking 1 mile (1.6 km) in about 15 minutes.  Biking.  Hiking.  Golfing.  Dancing.  Water aerobics. Vigorous-intensity exercise ideas include:  Walking 4.5 miles (7.2 km) or more in about 1 hour.  Jogging or running 5 miles (8 km) in about 1 hour.  Biking 10 miles (16.1 km) or more in about 1 hour.  Lap swimming.  Roller-skating or in-line skating.  Cross-country skiing.  Vigorous competitive sports, such as football, basketball, and soccer.  Jumping rope.  Aerobic dancing. What are some everyday activities that can help me to get exercise?  Yard work, such as: ? Pushing a lawn mower. ? Raking and bagging leaves.  Washing your car.  Pushing a stroller.  Shoveling snow.  Gardening.  Washing windows or floors. How can I be more active in my day-to-day activities?  Use stairs instead of an elevator.  Take a walk during your lunch break.  If you drive, park your car farther away from your work or school.  If you take public transportation, get off one stop early and walk the rest of the way.  Stand up or walk around during all of your indoor phone calls.  Get up, stretch, and walk around every 30 minutes throughout the day.  Enjoy exercise with a friend. Support to continue exercising will help you keep a regular routine of activity. What guidelines can I   follow while exercising?  Before you start a new exercise program, talk with your health care provider.  Do not exercise so much that you hurt yourself, feel dizzy, or get very short of breath.  Wear comfortable clothes and wear shoes with good support.  Drink plenty of  water while you exercise to prevent dehydration or heat stroke.  Work out until your breathing and your heartbeat get faster. Where to find more information  U.S. Department of Health and Human Services: ThisPath.fi  Centers for Disease Control and Prevention (CDC): FootballExhibition.com.br Summary  Exercising regularly is important. It will improve your overall fitness, flexibility, and endurance.  Regular exercise also will improve your overall health. It can help you control your weight, reduce stress, and improve your bone density.  Do not exercise so much that you hurt yourself, feel dizzy, or get very short of breath.  Before you start a new exercise program, talk with your health care provider. This information is not intended to replace advice given to you by your health care provider. Make sure you discuss any questions you have with your health care provider. Document Released: 07/09/2010 Document Revised: 04/27/2017 Document Reviewed: 04/27/2017 Elsevier Interactive Patient Education  2019 [employer].

## 2018-08-01 LAB — HEPATITIS PANEL, ACUTE
HEP A IGM: NEGATIVE
Hep B C IgM: NEGATIVE
Hepatitis B Surface Ag: NEGATIVE

## 2018-08-01 LAB — THYROID PANEL WITH TSH
Free Thyroxine Index: 1.3 (ref 1.2–4.9)
T3 Uptake Ratio: 22 % — ABNORMAL LOW (ref 24–39)
T4, Total: 5.8 ug/dL (ref 4.5–12.0)
TSH: 3.07 u[IU]/mL (ref 0.450–4.500)

## 2018-08-01 LAB — HIV ANTIBODY (ROUTINE TESTING W REFLEX): HIV Screen 4th Generation wRfx: NONREACTIVE

## 2018-08-01 LAB — RPR: RPR Ser Ql: NONREACTIVE

## 2018-08-01 LAB — ESTRADIOL: Estradiol: 12.4 pg/mL

## 2018-08-01 LAB — FOLLICLE STIMULATING HORMONE: FSH: 39.5 m[IU]/mL

## 2018-08-01 NOTE — Progress Notes (Signed)
Negative, Released to mychart 

## 2018-08-02 LAB — CYTOLOGY - PAP
DIAGNOSIS: NEGATIVE
HPV: NOT DETECTED

## 2018-08-03 ENCOUNTER — Telehealth: Payer: Self-pay | Admitting: Obstetrics and Gynecology

## 2018-08-03 LAB — NUSWAB VAGINITIS PLUS (VG+)
Atopobium vaginae: HIGH Score — AB
BVAB 2: HIGH Score — AB
CHLAMYDIA TRACHOMATIS, NAA: NEGATIVE
Candida albicans, NAA: NEGATIVE
Candida glabrata, NAA: NEGATIVE
Megasphaera 1: HIGH Score — AB
NEISSERIA GONORRHOEAE, NAA: NEGATIVE
TRICH VAG BY NAA: NEGATIVE

## 2018-08-03 NOTE — Telephone Encounter (Signed)
Patient seen on 07/31/18. Patient advised to schedule 1 week follow up for ultrasound for irregular bleeding. Called and left voicemail for patient to call back to be schedule

## 2018-08-05 ENCOUNTER — Other Ambulatory Visit: Payer: Self-pay | Admitting: Family Medicine

## 2018-08-05 DIAGNOSIS — F331 Major depressive disorder, recurrent, moderate: Secondary | ICD-10-CM

## 2018-08-07 ENCOUNTER — Other Ambulatory Visit: Payer: Self-pay | Admitting: Obstetrics and Gynecology

## 2018-08-07 DIAGNOSIS — B9689 Other specified bacterial agents as the cause of diseases classified elsewhere: Secondary | ICD-10-CM

## 2018-08-07 DIAGNOSIS — N760 Acute vaginitis: Principal | ICD-10-CM

## 2018-08-07 MED ORDER — METRONIDAZOLE 500 MG PO TABS: 500.0000 mg | ORAL_TABLET | Freq: Two times a day (BID) | ORAL | 0 refills | Status: AC

## 2018-08-07 NOTE — Progress Notes (Signed)
Called patient, voicemail full. No answer. Sent rx to pharmacy. Message sent to mychart.

## 2018-08-27 ENCOUNTER — Other Ambulatory Visit: Payer: Self-pay | Admitting: Family Medicine

## 2018-08-27 DIAGNOSIS — J45909 Unspecified asthma, uncomplicated: Secondary | ICD-10-CM

## 2018-08-30 ENCOUNTER — Other Ambulatory Visit: Payer: Self-pay | Admitting: Family Medicine

## 2018-08-30 DIAGNOSIS — F331 Major depressive disorder, recurrent, moderate: Secondary | ICD-10-CM

## 2018-08-30 NOTE — Telephone Encounter (Signed)
Requested medication (s) are due for refill today:  Yes   Requested medication (s) are on the active medication list:  yes  Future visit scheduled:  no  Last Refill: Sertraline 08/06/18; #90; no refills                    Amlodipine-Benazepril; 06/04/18; #90; no refills  Seen 10/06/17 and was to return in on month for f/u Called pt.; left vm to call to schedule a f/u appt.    Requested Prescriptions  Pending Prescriptions Disp Refills   sertraline (ZOLOFT) 100 MG tablet [Pharmacy Med Name: SERTRALINE 100MG  TABLETS] 90 tablet 0    Sig: TAKE 1 TABLET BY MOUTH DAILY     Psychiatry:  Antidepressants - SSRI Failed - 08/30/2018 10:22 AM      Failed - Valid encounter within last 6 months    Recent Outpatient Visits          6 months ago Asthma due to environmental allergies   [facility] Osvaldo Angst M, PA-C   10 months ago Essential hypertension   [facility] [place], Megan P, DO   1 year ago Upper respiratory tract infection, unspecified type   [facility] Roosvelt Maser [place], [place]   1 year ago Moderate episode of recurrent major depressive disorder [facility])   [facility] Particia Nearing, [place]   2 years ago Upper respiratory infection, viral   [facility], [place], [place]             Passed - Completed PHQ-2 or PHQ-9 in the last 360 days.     amLODipine-benazepril (LOTREL) 5-10 MG capsule [Pharmacy Med Name: AMLODIPINE-BENAZ 5/10MG  CAPSULES] 90 capsule 0    Sig: TAKE 1 CAPSULE BY MOUTH DAILY     Cardiovascular: CCB + ACEI Combos Failed - 08/30/2018 10:22 AM      Failed - Cr in normal range and within 180 days    Creatinine, Ser  Date Value Ref Range Status  10/06/2017 0.69 0.57 - 1.00 mg/dL Final         Failed - K in normal range and within 180 days    Potassium  Date Value Ref Range Status  10/06/2017 4.2 3.5 - 5.2 mmol/L Final         Failed - Last BP in normal range    BP  Readings from Last 1 Encounters:  07/31/18 (!) 148/100         Failed - Valid encounter within last 6 months    Recent Outpatient Visits          6 months ago Asthma due to environmental allergies   [facility] Osvaldo Angst M, PA-C   10 months ago Essential hypertension   [facility] [place], [place], [place]   1 year ago Upper respiratory tract infection, unspecified type   [facility] Particia Nearing, [place]   1 year ago Moderate episode of recurrent major depressive disorder [facility])   [facility] Particia Nearing, [place]   2 years ago Upper respiratory infection, viral   [facility], [place], [place]             TGGYIR - Patient is not pregnant

## 2018-08-30 NOTE — Telephone Encounter (Signed)
Requested medication (s) are due for refill today:  yes  Requested medication (s) are on the active medication list:  yes  Future visit scheduled:  no  Last Refill: 05/21/18; #30; no refills  Left vm. earlier today for pt. to call and schedule an appt.   Requested Prescriptions  Pending Prescriptions Disp Refills   metoprolol succinate (TOPROL-XL) 50 MG 24 hr tablet [Pharmacy Med Name: METOPROLOL ER SUCCINATE 50MG  TABS] 30 tablet 0    Sig: TAKE 1 TABLET BY MOUTH EVERY DAY     Cardiovascular:  Beta Blockers Failed - 08/30/2018  1:18 PM      Failed - Last BP in normal range    BP Readings from Last 1 Encounters:  07/31/18 (!) 148/100         Failed - Valid encounter within last 6 months    Recent Outpatient Visits          6 months ago Asthma due to environmental allergies   [facility] Jodi [place], Adriana M, PA-C   10 months ago Essential hypertension   [facility] [place], Megan P, DO   1 year ago Upper respiratory tract infection, unspecified type   [facility] Particia Nearing, [place]   1 year ago Moderate episode of recurrent major depressive disorder [facility])   [facility] Particia Nearing, [place]   2 years ago Upper respiratory infection, viral   [facility], [place], [place]             Passed - Last Heart Rate in normal range    Pulse Readings from Last 1 Encounters:  07/31/18 88

## 2018-08-31 ENCOUNTER — Encounter: Payer: Self-pay | Admitting: Family Medicine

## 2018-08-31 NOTE — Telephone Encounter (Signed)
LVM for pt to call and schedule an appt °

## 2018-10-09 ENCOUNTER — Ambulatory Visit: Payer: Self-pay

## 2018-10-09 NOTE — Telephone Encounter (Signed)
Patient called and says she's not having any symptoms of COVID-19, but is concerned about exposure at her job and asked about being tested. I advised testing is not being done in the practices and if there are symptoms and no SOB, then patient's are being advised to treat symptoms at home with Tylenol and self isolate. I advised if there is SOB, then patient's are being advised to go to the ED and they will determine testing or not, she verbalized understanding. She says she works at [employer] and there has been several cases with staff and inmates who have tested positive. She says her department has been isolated from the rest of the camp, she's a Visual merchandiser. She says she's wearing a mask and gloves, taking precautions with hand sanitizer, washing hands and social distancing. She says she's just worried because she has a child at home and don't want to take anything home to her. Care advice given and I advised I would send this to Fleet Contras to let her know about the concerns, she verbalized understanding.   Reason for Disposition . COVID -19, questions about  Protocols used: CORONAVIRUS (COVID-19) EXPOSURE-A-AH

## 2018-10-10 NOTE — Telephone Encounter (Signed)
Tried calling patient, went straight to answering service. Mailbox is full and unable to accept messages at this time. Will try again later.

## 2018-10-10 NOTE — Telephone Encounter (Signed)
Tried patient again. No answer. No voicemail set up.

## 2018-10-10 NOTE — Telephone Encounter (Signed)
Agree with RN advise to pt. Please have her call back if she does develop any sxs

## 2018-10-11 NOTE — Telephone Encounter (Signed)
Tried calling patient. No answer and VM box was full. Unable to leave a VM.

## 2018-10-11 NOTE — Telephone Encounter (Signed)
Attempted to reach pt. VM box was full and I was unable to leave a message. Will attempt again later.

## 2018-10-29 ENCOUNTER — Other Ambulatory Visit: Payer: Self-pay | Admitting: Family Medicine

## 2018-10-29 NOTE — Telephone Encounter (Signed)
Tried to LVM to schedule appt

## 2018-10-29 NOTE — Telephone Encounter (Signed)
Requested medication (s) are due for refill today -yes- may be over due  Requested medication (s) are on the active medication list -yes  Future visit scheduled -no  Last refill: 08/31/18  Notes to clinic: Attempted to call patient to see if appointment could be scheduled- left message on voice mail to call office for appointment. Request for refill sent for review.  Requested Prescriptions  Pending Prescriptions Disp Refills   metoprolol succinate (TOPROL-XL) 50 MG 24 hr tablet [Pharmacy Med Name: METOPROLOL ER SUCCINATE 50MG  TABS] 30 tablet 0    Sig: TAKE 1 TABLET BY MOUTH EVERY DAY     Cardiovascular:  Beta Blockers Failed - 10/29/2018  8:24 AM      Failed - Last BP in normal range    BP Readings from Last 1 Encounters:  07/31/18 (!) 148/100         Failed - Valid encounter within last 6 months    Recent Outpatient Visits          8 months ago Asthma due to environmental allergies   [facility] Trey Sailors, PA-C   1 year ago Essential hypertension   [facility] [place], [place], [place]   1 year ago Upper respiratory tract infection, unspecified type   [facility] Particia Nearing, [place]   1 year ago Moderate episode of recurrent major depressive disorder [facility])   [facility] Particia Nearing, [place]   2 years ago Upper respiratory infection, viral   [facility] [place], [place], [place]             Passed - Last Heart Rate in normal range    Pulse Readings from Last 1 Encounters:  07/31/18 88          Requested Prescriptions  Pending Prescriptions Disp Refills   metoprolol succinate (TOPROL-XL) 50 MG 24 hr tablet [Pharmacy Med Name: METOPROLOL ER SUCCINATE 50MG  TABS] 30 tablet 0    Sig: TAKE 1 TABLET BY MOUTH EVERY DAY     Cardiovascular:  Beta Blockers Failed - 10/29/2018  8:24 AM      Failed - Last BP in normal range    BP Readings from Last 1 Encounters:  07/31/18 (!) 148/100          Failed - Valid encounter within last 6 months    Recent Outpatient Visits          8 months ago Asthma due to environmental allergies   [facility] Trey Sailors, PA-C   1 year ago Essential hypertension   [facility] [place], [place], DO   1 year ago Upper respiratory tract infection, unspecified type   [facility] Particia Nearing, [place]   1 year ago Moderate episode of recurrent major depressive disorder [facility])   [facility] Particia Nearing, [place]   2 years ago Upper respiratory infection, viral   [facility] [place], [place], [place]             Passed - Last Heart Rate in normal range    Pulse Readings from Last 1 Encounters:  07/31/18 88

## 2018-12-04 ENCOUNTER — Telehealth: Payer: Self-pay | Admitting: Family Medicine

## 2018-12-04 ENCOUNTER — Encounter: Payer: Self-pay | Admitting: Family Medicine

## 2018-12-04 DIAGNOSIS — F331 Major depressive disorder, recurrent, moderate: Secondary | ICD-10-CM

## 2018-12-04 NOTE — Telephone Encounter (Signed)
LVM for pt to call back will also printing letter to mail.

## 2018-12-04 NOTE — Telephone Encounter (Signed)
Patient received a courtesy supply on 08-31-2018 / Patient needs an appointment. /

## 2019-01-02 ENCOUNTER — Other Ambulatory Visit: Payer: Self-pay | Admitting: Family Medicine

## 2019-01-02 DIAGNOSIS — F331 Major depressive disorder, recurrent, moderate: Secondary | ICD-10-CM

## 2019-01-03 NOTE — Telephone Encounter (Signed)
LVM for pt to call back.

## 2019-01-08 ENCOUNTER — Encounter: Payer: Self-pay | Admitting: Family Medicine

## 2019-01-08 ENCOUNTER — Ambulatory Visit (INDEPENDENT_AMBULATORY_CARE_PROVIDER_SITE_OTHER): Payer: BC Managed Care – PPO | Admitting: Family Medicine

## 2019-01-08 ENCOUNTER — Other Ambulatory Visit: Payer: Self-pay

## 2019-01-08 DIAGNOSIS — F331 Major depressive disorder, recurrent, moderate: Secondary | ICD-10-CM

## 2019-01-08 DIAGNOSIS — J45909 Unspecified asthma, uncomplicated: Secondary | ICD-10-CM | POA: Diagnosis not present

## 2019-01-08 MED ORDER — SERTRALINE HCL 100 MG PO TABS: 100.0000 mg | ORAL_TABLET | Freq: Every day | ORAL | 1 refills | Status: DC

## 2019-01-08 MED ORDER — MONTELUKAST SODIUM 10 MG PO TABS: ORAL_TABLET | ORAL | 1 refills | Status: DC

## 2019-01-08 MED ORDER — ALBUTEROL SULFATE HFA 108 (90 BASE) MCG/ACT IN AERS: 2.0000 | INHALATION_SPRAY | Freq: Four times a day (QID) | RESPIRATORY_TRACT | 3 refills | Status: DC | PRN

## 2019-01-08 NOTE — Progress Notes (Signed)
Ht 5\' 5"  (1.651 m)   Wt (!) 315 lb (142.9 kg)   BMI 52.42 kg/m    Subjective:    Patient ID: Breanna Rosario, female    DOB: 06/09/1976, 43 y.o.   MRN: [id number]  HPI: Breanna Rosario is a 43 y.o. female  Chief Complaint  Patient presents with  . Depression    No complaints, patient states she needs her medications refilled    . This visit was completed via WebEx due to the restrictions of the COVID-19 pandemic. All issues as above were discussed and addressed. Physical exam was done as above through visual confirmation on WebEx. If it was felt that the patient should be evaluated in the office, they were directed there. The patient verbally consented to this visit. . Location of the patient: in parked car . Location of the provider: home . Those involved with this call:  . Provider: Roosvelt Maser, PA-C . CMA: Myrtha Mantis, CMA . Front Desk/Registration: Harriet Pho  . Time spent on call: 15 minutes with patient face to face via video conference. More than 50% of this time was spent in counseling and coordination of care. 5 minutes total spent in review of patient's record and preparation of their chart. I verified patient identity using two factors (patient name and date of birth). Patient consents verbally to being seen via telemedicine visit today.   Here today for mood follow up. Has been taking 100 mg zoloft for quite some time now and states this does very well for her mood control. Feels balanced, calm, overall happy. Denies SI/HI, severe mood swings, sleep or appetite issues. No new concerns today.   Depression screen [facility] 2/9 01/08/2019 02/07/2018 10/06/2017  Decreased Interest 2 2 1   Down, Depressed, Hopeless 2 1 1   PHQ - 2 Score 4 3 2   Altered sleeping 3 2 2   Tired, decreased energy 3 3 2   Change in appetite 2 0 2  Feeling bad or failure about yourself  2 1 2   Trouble concentrating 2 2 2   Moving slowly or fidgety/restless 2 0 2  Suicidal thoughts 0 0 0  PHQ-9  Score 18 11 14   Difficult doing work/chores Somewhat difficult - -    Relevant past medical, surgical, family and social history reviewed and updated as indicated. Interim medical history since our last visit reviewed. Allergies and medications reviewed and updated.  Review of Systems  Per HPI unless specifically indicated above     Objective:    Ht 5\' 5"  (1.651 m)   Wt (!) 315 lb (142.9 kg)   BMI 52.42 kg/m   Wt Readings from Last 3 Encounters:  01/08/19 (!) 315 lb (142.9 kg)  07/31/18 (!) 306 lb (138.8 kg)  02/07/18 (!) 305 lb (138.3 kg)    Physical Exam Vitals signs and nursing note reviewed.  Constitutional:      General: She is not in acute distress.    Appearance: Normal appearance.  HENT:     Head: Atraumatic.     Right Ear: External ear normal.     Left Ear: External ear normal.     Nose: Nose normal. No congestion.     Mouth/Throat:     Mouth: Mucous membranes are moist.     Pharynx: Oropharynx is clear. No posterior oropharyngeal erythema.  Eyes:     Extraocular Movements: Extraocular movements intact.     Conjunctiva/sclera: Conjunctivae normal.  Neck:     Musculoskeletal: Normal range of motion.  Cardiovascular:     Comments: Unable to assess via virtual visit Pulmonary:     Effort: Pulmonary effort is normal. No respiratory distress.  Musculoskeletal: Normal range of motion.  Skin:    General: Skin is dry.     Findings: No erythema.  Neurological:     Mental Status: She is alert and oriented to person, place, and time.  Psychiatric:        Mood and Affect: Mood normal.        Thought Content: Thought content normal.        Judgment: Judgment normal.     Results for orders placed or performed in visit on 07/31/18  Thyroid Panel With TSH  Result Value Ref Range   TSH 3.070 0.450 - 4.500 uIU/mL   T4, Total 5.8 4.5 - 12.0 ug/dL   T3 Uptake Ratio 22 (L) 24 - 39 %   Free Thyroxine Index 1.3 1.2 - 4.9  FSH  Result Value Ref Range   FSH 39.5  mIU/mL  Estradiol  Result Value Ref Range   Estradiol 12.4 pg/mL  HIV Antibody (routine testing w rflx)  Result Value Ref Range   HIV Screen 4th Generation wRfx Non Reactive Non Reactive  RPR  Result Value Ref Range   RPR Ser Ql Non Reactive Non Reactive  Hepatitis panel, acute  Result Value Ref Range   Hep A IgM Negative Negative   Hepatitis B Surface Ag Negative Negative   Hep B C IgM Negative Negative   Hep C Virus Ab <0.1 0.0 - 0.9 s/co ratio  NuSwab Vaginitis Plus (VG+)  Result Value Ref Range   Atopobium vaginae High - 2 (A) Score   BVAB 2 High - 2 (A) Score   Megasphaera 1 High - 2 (A) Score   Candida albicans, NAA Negative Negative   Candida glabrata, NAA Negative Negative   Trich vag by NAA Negative Negative   Chlamydia trachomatis, NAA Negative Negative   Neisseria gonorrhoeae, NAA Negative Negative  Cytology - PAP  Result Value Ref Range   Adequacy      Satisfactory for evaluation  endocervical/transformation zone component PRESENT.   Diagnosis      NEGATIVE FOR INTRAEPITHELIAL LESIONS OR MALIGNANCY.   HPV NOT DETECTED    Material Submitted CervicoVaginal Pap [ThinPrep Imaged]       Assessment & Plan:   Problem List Items Addressed This Visit      Respiratory   Asthma due to environmental allergies   Relevant Medications   montelukast (SINGULAIR) 10 MG tablet   albuterol (VENTOLIN HFA) 108 (90 Base) MCG/ACT inhaler     Other   Major depression, recurrent (HCC)    Chronic - stable and under good control, continue current regimen      Relevant Medications   sertraline (ZOLOFT) 100 MG tablet       Follow up plan: Return in about 6 months (around 07/11/2019) for CPE.

## 2019-01-09 NOTE — Assessment & Plan Note (Signed)
Chronic - stable and under good control, continue current regimen

## 2019-06-27 ENCOUNTER — Other Ambulatory Visit: Payer: Self-pay | Admitting: Family Medicine

## 2019-06-27 DIAGNOSIS — F331 Major depressive disorder, recurrent, moderate: Secondary | ICD-10-CM

## 2019-06-27 DIAGNOSIS — J45909 Unspecified asthma, uncomplicated: Secondary | ICD-10-CM

## 2019-08-26 ENCOUNTER — Encounter: Payer: Self-pay | Admitting: Family Medicine

## 2019-08-26 ENCOUNTER — Other Ambulatory Visit: Payer: Self-pay | Admitting: Family Medicine

## 2019-08-26 DIAGNOSIS — F331 Major depressive disorder, recurrent, moderate: Secondary | ICD-10-CM

## 2019-08-26 NOTE — Telephone Encounter (Signed)
Sent mychart letter, mailing letter as well. Will follow up

## 2019-08-26 NOTE — Telephone Encounter (Signed)
Attempted to call patient to schedule an appointment for the refill for sertraline 100 mg. No answer, left VM for pt to call back and schedule.

## 2019-08-27 NOTE — Telephone Encounter (Signed)
Your patient 

## 2019-08-27 NOTE — Telephone Encounter (Signed)
LVM for pt to call back to schedule an appointment. Routing to close encounter.

## 2019-08-29 ENCOUNTER — Encounter: Payer: Self-pay | Admitting: Family Medicine

## 2019-08-29 ENCOUNTER — Ambulatory Visit: Payer: BC Managed Care – PPO | Admitting: Family Medicine

## 2019-08-29 ENCOUNTER — Other Ambulatory Visit: Payer: Self-pay

## 2019-08-29 VITALS — BP 139/84 | HR 76 | Temp 98.6°F | Ht 65.0 in | Wt 318.0 lb

## 2019-08-29 DIAGNOSIS — F331 Major depressive disorder, recurrent, moderate: Secondary | ICD-10-CM | POA: Diagnosis not present

## 2019-08-29 DIAGNOSIS — J3089 Other allergic rhinitis: Secondary | ICD-10-CM

## 2019-08-29 DIAGNOSIS — I129 Hypertensive chronic kidney disease with stage 1 through stage 4 chronic kidney disease, or unspecified chronic kidney disease: Secondary | ICD-10-CM

## 2019-08-29 MED ORDER — SERTRALINE HCL 100 MG PO TABS: ORAL_TABLET | ORAL | 1 refills | Status: DC

## 2019-08-29 NOTE — Progress Notes (Signed)
BP 139/84   Pulse 76   Temp 98.6 F (37 C) (Oral)   Ht 5\' 5"  (1.651 m)   Wt (!) 318 lb (144.2 kg)   SpO2 98%   BMI 52.92 kg/m    Subjective:    Patient ID: Breanna Rosario, female    DOB: 03-03-1976, 44 y.o.   MRN: [id number]  HPI: Breanna Rosario is a 44 y.o. female  Chief Complaint  Patient presents with  . Depression  . Medication Refill   Presenting today for chronic condition f/u.   Singulair helping significantly with her allergies and reactive airway, has not had a breathing issue since being on it. Has a breo and albuterol inahler if needed but has not needed yet.   HTN, hx of MI - Managed by Cardiology, stable BPs on home and office visit checks. No CP, SOB, HAs, dizziness.   Depression - continues to do well on zoloft, has been on it for years. Moods stable, denies new concerns or side effects.   Depression screen [facility] 2/9 08/29/2019 01/08/2019 02/07/2018  Decreased Interest 2 2 2   Down, Depressed, Hopeless 1 2 1   PHQ - 2 Score 3 4 3   Altered sleeping 2 3 2   Tired, decreased energy 2 3 3   Change in appetite 1 2 0  Feeling bad or failure about yourself  1 2 1   Trouble concentrating 1 2 2   Moving slowly or fidgety/restless 0 2 0  Suicidal thoughts 0 0 0  PHQ-9 Score 10 18 11   Difficult doing work/chores - Somewhat difficult -   GAD 7 : Generalized Anxiety Score 01/08/2019 10/06/2017  Nervous, Anxious, on Edge 2 2  Control/stop worrying 2 2  Worry too much - different things 2 2  Trouble relaxing 2 2  Restless 3 2  Easily annoyed or irritable 2 2  Afraid - awful might happen 2 1  Total GAD 7 Score 15 13  Anxiety Difficulty Somewhat difficult Somewhat difficult     Relevant past medical, surgical, family and social history reviewed and updated as indicated. Interim medical history since our last visit reviewed. Allergies and medications reviewed and updated.  Review of Systems  Per HPI unless specifically indicated above     Objective:    BP  139/84   Pulse 76   Temp 98.6 F (37 C) (Oral)   Ht 5\' 5"  (1.651 m)   Wt (!) 318 lb (144.2 kg)   SpO2 98%   BMI 52.92 kg/m   Wt Readings from Last 3 Encounters:  08/29/19 (!) 318 lb (144.2 kg)  01/08/19 (!) 315 lb (142.9 kg)  07/31/18 (!) 306 lb (138.8 kg)    Physical Exam Vitals and nursing note reviewed.  Constitutional:      Appearance: Normal appearance. She is not ill-appearing.  HENT:     Head: Atraumatic.  Eyes:     Extraocular Movements: Extraocular movements intact.     Conjunctiva/sclera: Conjunctivae normal.  Cardiovascular:     Rate and Rhythm: Normal rate and regular rhythm.     Heart sounds: Normal heart sounds.  Pulmonary:     Effort: Pulmonary effort is normal.     Breath sounds: Normal breath sounds.  Musculoskeletal:        General: Normal range of motion.     Cervical back: Normal range of motion and neck supple.  Skin:    General: Skin is warm and dry.  Neurological:     Mental Status: She is alert  and oriented to person, place, and time.  Psychiatric:        Mood and Affect: Mood normal.        Thought Content: Thought content normal.        Judgment: Judgment normal.     Results for orders placed or performed in visit on 07/31/18  Thyroid Panel With TSH  Result Value Ref Range   TSH 3.070 0.450 - 4.500 uIU/mL   T4, Total 5.8 4.5 - 12.0 ug/dL   T3 Uptake Ratio 22 (L) 24 - 39 %   Free Thyroxine Index 1.3 1.2 - 4.9  FSH  Result Value Ref Range   FSH 39.5 mIU/mL  Estradiol  Result Value Ref Range   Estradiol 12.4 pg/mL  HIV Antibody (routine testing w rflx)  Result Value Ref Range   HIV Screen 4th Generation wRfx Non Reactive Non Reactive  RPR  Result Value Ref Range   RPR Ser Ql Non Reactive Non Reactive  Hepatitis panel, acute  Result Value Ref Range   Hep A IgM Negative Negative   Hepatitis B Surface Ag Negative Negative   Hep B C IgM Negative Negative   Hep C Virus Ab <0.1 0.0 - 0.9 s/co ratio  NuSwab Vaginitis Plus (VG+)   Result Value Ref Range   Atopobium vaginae High - 2 (A) Score   BVAB 2 High - 2 (A) Score   Megasphaera 1 High - 2 (A) Score   Candida albicans, NAA Negative Negative   Candida glabrata, NAA Negative Negative   Trich vag by NAA Negative Negative   Chlamydia trachomatis, NAA Negative Negative   Neisseria gonorrhoeae, NAA Negative Negative  Cytology - PAP  Result Value Ref Range   Adequacy      Satisfactory for evaluation  endocervical/transformation zone component PRESENT.   Diagnosis      NEGATIVE FOR INTRAEPITHELIAL LESIONS OR MALIGNANCY.   HPV NOT DETECTED    Material Submitted CervicoVaginal Pap [ThinPrep Imaged]       Assessment & Plan:   Problem List Items Addressed This Visit      Respiratory   Allergic rhinitis    Stable and well controlled, continue current regimen        Genitourinary   Benign hypertensive renal disease    BPs stable and WNL, continue current regimen        Other   Major depression, recurrent (HCC) - Primary    Stable and well controlled, continue current regimen      Relevant Medications   sertraline (ZOLOFT) 100 MG tablet       Follow up plan: Return in about 6 months (around 02/29/2020) for CPE.

## 2019-09-03 DIAGNOSIS — J309 Allergic rhinitis, unspecified: Secondary | ICD-10-CM | POA: Insufficient documentation

## 2019-09-03 NOTE — Assessment & Plan Note (Signed)
BPs stable and WNL, continue current regimen 

## 2019-09-03 NOTE — Assessment & Plan Note (Signed)
Stable and well controlled, continue current regimen 

## 2019-11-26 ENCOUNTER — Other Ambulatory Visit: Payer: Self-pay | Admitting: Family Medicine

## 2019-11-26 DIAGNOSIS — J45909 Unspecified asthma, uncomplicated: Secondary | ICD-10-CM

## 2019-11-26 NOTE — Telephone Encounter (Signed)
Requested Prescriptions  Pending Prescriptions Disp Refills  . montelukast (SINGULAIR) 10 MG tablet [Pharmacy Med Name: MONTELUKAST 10MG  TABLETS] 90 tablet 1    Sig: TAKE 1 TABLET(10 MG) BY MOUTH AT BEDTIME     Pulmonology:  Leukotriene Inhibitors Passed - 11/26/2019  8:33 PM      Passed - Valid encounter within last 12 months    Recent Outpatient Visits          2 months ago Moderate episode of recurrent major depressive disorder [facility])   [facility], Particia Nearing   10 months ago Moderate episode of recurrent major depressive disorder [facility])   [facility], Particia Nearing   1 year ago Asthma due to environmental allergies   [facility], [facility]-C   2 years ago Essential hypertension   [facility] [place], [place], DO   2 years ago Upper respiratory tract infection, unspecified type   [facility] [place], [place], [place]

## 2020-03-12 ENCOUNTER — Other Ambulatory Visit: Payer: Self-pay | Admitting: Family Medicine

## 2020-03-12 DIAGNOSIS — F331 Major depressive disorder, recurrent, moderate: Secondary | ICD-10-CM

## 2020-03-12 NOTE — Telephone Encounter (Signed)
Requested Prescriptions  Pending Prescriptions Disp Refills  . sertraline (ZOLOFT) 100 MG tablet [Pharmacy Med Name: SERTRALINE 100MG  TABLETS] 30 tablet 0    Sig: TAKE 1 TABLET(100 MG) BY MOUTH DAILY     Psychiatry:  Antidepressants - SSRI Failed - 03/12/2020 10:10 AM      Failed - Valid encounter within last 6 months    Recent Outpatient Visits          6 months ago Moderate episode of recurrent major depressive disorder [facility])   [facility], PA-C   1 year ago Moderate episode of recurrent major depressive disorder [facility])   [facility] [place]   2 years ago Asthma due to environmental allergies   [facility], Trey Sailors   2 years ago Essential hypertension   [facility] [place], [place], [place]   2 years ago Upper respiratory tract infection, unspecified type   [facility] [place], [place]             Passed - Completed PHQ-2 or PHQ-9 in the last 360 days.       Attempted to call pt.  Left vm to call office to schedule 6 mo. F/u appt.  Courtesy refill given @ this time; #30; no refills.

## 2020-04-17 ENCOUNTER — Other Ambulatory Visit: Payer: Self-pay | Admitting: Family Medicine

## 2020-04-17 DIAGNOSIS — F331 Major depressive disorder, recurrent, moderate: Secondary | ICD-10-CM

## 2020-04-22 ENCOUNTER — Other Ambulatory Visit: Payer: Self-pay

## 2020-04-22 ENCOUNTER — Encounter: Payer: Self-pay | Admitting: Family Medicine

## 2020-04-22 ENCOUNTER — Ambulatory Visit: Payer: BC Managed Care – PPO | Admitting: Family Medicine

## 2020-04-22 VITALS — BP 135/86 | HR 73 | Temp 97.8°F | Wt 325.2 lb

## 2020-04-22 DIAGNOSIS — J45909 Unspecified asthma, uncomplicated: Secondary | ICD-10-CM

## 2020-04-22 DIAGNOSIS — I129 Hypertensive chronic kidney disease with stage 1 through stage 4 chronic kidney disease, or unspecified chronic kidney disease: Secondary | ICD-10-CM

## 2020-04-22 DIAGNOSIS — Z6841 Body Mass Index (BMI) 40.0 and over, adult: Secondary | ICD-10-CM

## 2020-04-22 DIAGNOSIS — F331 Major depressive disorder, recurrent, moderate: Secondary | ICD-10-CM

## 2020-04-22 DIAGNOSIS — E782 Mixed hyperlipidemia: Secondary | ICD-10-CM | POA: Diagnosis not present

## 2020-04-22 DIAGNOSIS — E66813 Obesity, class 3: Secondary | ICD-10-CM

## 2020-04-22 LAB — MICROALBUMIN, URINE WAIVED
Creatinine, Urine Waived: 100 mg/dL (ref 10–300)
Microalb, Ur Waived: 150 mg/L — ABNORMAL HIGH (ref 0–19)
Microalb/Creat Ratio: >300 mg/g — ABNORMAL HIGH (ref <30)

## 2020-04-22 MED ORDER — METOPROLOL SUCCINATE ER 50 MG PO TB24
50.0000 mg | ORAL_TABLET | Freq: Every day | ORAL | 1 refills | Status: DC
Start: 2020-04-22 — End: 2021-03-30

## 2020-04-22 MED ORDER — CLOPIDOGREL BISULFATE 75 MG PO TABS
75.0000 mg | ORAL_TABLET | Freq: Every day | ORAL | 1 refills | Status: DC
Start: 2020-04-22 — End: 2021-03-30

## 2020-04-22 MED ORDER — AMLODIPINE BESY-BENAZEPRIL HCL 5-10 MG PO CAPS
1.0000 | ORAL_CAPSULE | Freq: Every day | ORAL | 1 refills | Status: DC
Start: 2020-04-22 — End: 2021-03-30

## 2020-04-22 MED ORDER — ALBUTEROL SULFATE HFA 108 (90 BASE) MCG/ACT IN AERS: 2.0000 | INHALATION_SPRAY | Freq: Four times a day (QID) | RESPIRATORY_TRACT | 3 refills | Status: AC | PRN

## 2020-04-22 MED ORDER — SERTRALINE HCL 100 MG PO TABS: 150.0000 mg | ORAL_TABLET | Freq: Every day | ORAL | 1 refills | Status: DC

## 2020-04-22 MED ORDER — BREO ELLIPTA 100-25 MCG/INH IN AEPB: INHALATION_SPRAY | RESPIRATORY_TRACT | 6 refills | Status: AC

## 2020-04-22 MED ORDER — ATORVASTATIN CALCIUM 80 MG PO TABS: ORAL_TABLET | ORAL | 1 refills | Status: DC

## 2020-04-22 NOTE — Progress Notes (Signed)
BP 135/86   Pulse 73   Temp 97.8 F (36.6 C) (Oral)   Wt (!) 325 lb 3.2 oz (147.5 kg)   SpO2 97%   BMI 54.12 kg/m    Subjective:    Patient ID: Breanna Rosario, female    DOB: 28-Mar-1976, 44 y.o.   MRN: [id number]  HPI: Breanna Rosario is a 44 y.o. female  Chief Complaint  Patient presents with  . Depression  . Hypertension  . Hyperlipidemia   HYPERTENSION / HYPERLIPIDEMIA Satisfied with current treatment? yes Duration of hypertension: chronic BP monitoring frequency: not checking BP range:  BP medication side effects: no Past BP meds: amlodipine, benazepril, metoprolol,  Duration of hyperlipidemia: chronic Cholesterol medication side effects: no Cholesterol supplements: none Past cholesterol medications: atorvastatin Medication compliance: good compliance Aspirin: yes Recent stressors: no Recurrent headaches: no Visual changes: no Palpitations: no Dyspnea: no Chest pain: no Lower extremity edema: no Dizzy/lightheaded: no  DEPRESSION Mood status: stable Satisfied with current treatment?: yes Symptom severity: moderate  Duration of current treatment : chronic Side effects: no Medication compliance: excellent compliance Psychotherapy/counseling: no  Previous psychiatric medications: zoloft Depressed mood: yes Anxious mood: no Anhedonia: no Significant weight loss or gain: no Insomnia: no  Fatigue: yes Feelings of worthlessness or guilt: no Impaired concentration/indecisiveness: no Suicidal ideations: no Hopelessness: no Crying spells: no Depression screen [facility] 2/9 04/22/2020 08/29/2019 01/08/2019 02/07/2018 10/06/2017  Decreased Interest 1 2 2 2 1   Down, Depressed, Hopeless 1 1 2 1 1   PHQ - 2 Score 2 3 4 3 2   Altered sleeping 2 2 3 2 2   Tired, decreased energy 3 2 3 3 2   Change in appetite 2 1 2  0 2  Feeling bad or failure about yourself  1 1 2 1 2   Trouble concentrating 1 1 2 2 2   Moving slowly or fidgety/restless 1 0 2 0 2  Suicidal thoughts 0  0 0 0 0  PHQ-9 Score 12 10 18 11 14   Difficult doing work/chores Somewhat difficult - Somewhat difficult - -    Relevant past medical, surgical, family and social history reviewed and updated as indicated. Interim medical history since our last visit reviewed. Allergies and medications reviewed and updated.  Review of Systems  Constitutional: Negative.   Respiratory: Negative.   Cardiovascular: Negative.   Gastrointestinal: Negative.   Musculoskeletal: Negative.   Skin: Negative.   Neurological: Negative.   Psychiatric/Behavioral: Positive for dysphoric mood. Negative for agitation, behavioral problems, confusion, decreased concentration, hallucinations, self-injury, sleep disturbance and suicidal ideas. The patient is not nervous/anxious and is not hyperactive.     Per HPI unless specifically indicated above     Objective:    BP 135/86   Pulse 73   Temp 97.8 F (36.6 C) (Oral)   Wt (!) 325 lb 3.2 oz (147.5 kg)   SpO2 97%   BMI 54.12 kg/m   Wt Readings from Last 3 Encounters:  04/22/20 (!) 325 lb 3.2 oz (147.5 kg)  08/29/19 (!) 318 lb (144.2 kg)  01/08/19 (!) 315 lb (142.9 kg)    Physical Exam Vitals and nursing note reviewed.  Constitutional:      General: She is not in acute distress.    Appearance: Normal appearance. She is obese. She is not ill-appearing, toxic-appearing or diaphoretic.  HENT:     Head: Normocephalic and atraumatic.     Right Ear: External ear normal.     Left Ear: External ear normal.     Nose:  Nose normal.     Mouth/Throat:     Mouth: Mucous membranes are moist.     Pharynx: Oropharynx is clear.  Eyes:     General: No scleral icterus.       Right eye: No discharge.        Left eye: No discharge.     Extraocular Movements: Extraocular movements intact.     Conjunctiva/sclera: Conjunctivae normal.     Pupils: Pupils are equal, round, and reactive to light.  Cardiovascular:     Rate and Rhythm: Normal rate and regular rhythm.     Pulses:  Normal pulses.     Heart sounds: Normal heart sounds. No murmur heard.  No friction rub. No gallop.   Pulmonary:     Effort: Pulmonary effort is normal. No respiratory distress.     Breath sounds: Normal breath sounds. No stridor. No wheezing, rhonchi or rales.  Chest:     Chest wall: No tenderness.  Musculoskeletal:        General: Normal range of motion.     Cervical back: Normal range of motion and neck supple.  Skin:    General: Skin is warm and dry.     Capillary Refill: Capillary refill takes less than 2 seconds.     Coloration: Skin is not jaundiced or pale.     Findings: No bruising, erythema, lesion or rash.  Neurological:     General: No focal deficit present.     Mental Status: She is alert and oriented to person, place, and time. Mental status is at baseline.  Psychiatric:        Mood and Affect: Mood normal.        Behavior: Behavior normal.        Thought Content: Thought content normal.        Judgment: Judgment normal.     Results for orders placed or performed in visit on 07/31/18  Thyroid Panel With TSH  Result Value Ref Range   TSH 3.070 0.450 - 4.500 uIU/mL   T4, Total 5.8 4.5 - 12.0 ug/dL   T3 Uptake Ratio 22 (L) 24 - 39 %   Free Thyroxine Index 1.3 1.2 - 4.9  FSH  Result Value Ref Range   FSH 39.5 mIU/mL  Estradiol  Result Value Ref Range   Estradiol 12.4 pg/mL  HIV Antibody (routine testing w rflx)  Result Value Ref Range   HIV Screen 4th Generation wRfx Non Reactive Non Reactive  RPR  Result Value Ref Range   RPR Ser Ql Non Reactive Non Reactive  Hepatitis panel, acute  Result Value Ref Range   Hep A IgM Negative Negative   Hepatitis B Surface Ag Negative Negative   Hep B C IgM Negative Negative   Hep C Virus Ab <0.1 0.0 - 0.9 s/co ratio  NuSwab Vaginitis Plus (VG+)  Result Value Ref Range   Atopobium vaginae High - 2 (A) Score   BVAB 2 High - 2 (A) Score   Megasphaera 1 High - 2 (A) Score   Candida albicans, NAA Negative Negative    Candida glabrata, NAA Negative Negative   Trich vag by NAA Negative Negative   Chlamydia trachomatis, NAA Negative Negative   Neisseria gonorrhoeae, NAA Negative Negative  Cytology - PAP  Result Value Ref Range   Adequacy      Satisfactory for evaluation  endocervical/transformation zone component PRESENT.   Diagnosis      NEGATIVE FOR INTRAEPITHELIAL LESIONS OR MALIGNANCY.   HPV  NOT DETECTED    Material Submitted CervicoVaginal Pap [ThinPrep Imaged]       Assessment & Plan:   Problem List Items Addressed This Visit      Respiratory   Asthma due to environmental allergies   Relevant Medications   fluticasone furoate-vilanterol (BREO ELLIPTA) 100-25 MCG/INH AEPB   albuterol (VENTOLIN HFA) 108 (90 Base) MCG/ACT inhaler     Genitourinary   Benign hypertensive renal disease - Primary    Under good control on current regimen. Continue current regimen. Continue to monitor. Call with any concerns. Refills given. Labs drawn today.      Relevant Orders   CBC with Differential/Platelet   Comprehensive metabolic panel   Microalbumin, Urine Waived     Other   Major depression, recurrent (HCC)    Would like to increase her dose to 150mg  daily. Follow up 1-2 months. Call with any concerns.       Relevant Medications   sertraline (ZOLOFT) 100 MG tablet   Other Relevant Orders   CBC with Differential/Platelet   Comprehensive metabolic panel   Class 3 severe obesity due to excess calories with body mass index (BMI) of 50.0 to 59.9 in adult [facility])    Encouraged diet and exercise with goal of losing 1-2 lbs per week. Continue to monitor.       Mixed hyperlipidemia    Under good control on current regimen. Continue current regimen. Continue to monitor. Call with any concerns. Refills given. Labs drawn today.       Relevant Medications   atorvastatin (LIPITOR) 80 MG tablet   amLODipine-benazepril (LOTREL) 5-10 MG capsule   metoprolol succinate (TOPROL-XL) 50 MG 24 hr tablet   Other  Relevant Orders   CBC with Differential/Platelet   Comprehensive metabolic panel   Lipid Panel w/o Chol/HDL Ratio       Follow up plan: Return 1-2 months follow up mood virtual OK.

## 2020-04-22 NOTE — Assessment & Plan Note (Signed)
Under good control on current regimen. Continue current regimen. Continue to monitor. Call with any concerns. Refills given. Labs drawn today.   

## 2020-04-22 NOTE — Assessment & Plan Note (Addendum)
Would like to increase her dose to 150mg  daily. Follow up 1-2 months. Call with any concerns.

## 2020-04-22 NOTE — Assessment & Plan Note (Signed)
Encouraged diet and exercise with goal of losing 1-2 lbs per week. Continue to monitor.  

## 2020-04-23 ENCOUNTER — Other Ambulatory Visit: Payer: Self-pay | Admitting: Family Medicine

## 2020-04-23 LAB — COMPREHENSIVE METABOLIC PANEL
ALT: 31 IU/L (ref 0–32)
AST: 24 IU/L (ref 0–40)
Albumin/Globulin Ratio: 1.2 (ref 1.2–2.2)
Albumin: 3.6 g/dL — ABNORMAL LOW (ref 3.8–4.8)
Alkaline Phosphatase: 111 IU/L (ref 44–121)
BUN/Creatinine Ratio: 22 (ref 9–23)
BUN: 19 mg/dL (ref 6–24)
Bilirubin Total: <0.2 mg/dL (ref 0.0–1.2)
CO2: 20 mmol/L (ref 20–29)
Calcium: 8.7 mg/dL (ref 8.7–10.2)
Chloride: 105 mmol/L (ref 96–106)
Creatinine, Ser: 0.86 mg/dL (ref 0.57–1.00)
GFR calc Af Amer: 95 mL/min/{1.73_m2} (ref >59)
GFR calc non Af Amer: 82 mL/min/{1.73_m2} (ref >59)
Globulin, Total: 3 g/dL (ref 1.5–4.5)
Glucose: 90 mg/dL (ref 65–99)
Potassium: 4.4 mmol/L (ref 3.5–5.2)
Sodium: 141 mmol/L (ref 134–144)
Total Protein: 6.6 g/dL (ref 6.0–8.5)

## 2020-04-23 LAB — CBC WITH DIFFERENTIAL/PLATELET
Basophils Absolute: 0 10*3/uL (ref 0.0–0.2)
Basos: 0 %
EOS (ABSOLUTE): 0.1 10*3/uL (ref 0.0–0.4)
Eos: 1 %
Hematocrit: 34.9 % (ref 34.0–46.6)
Hemoglobin: 11.4 g/dL (ref 11.1–15.9)
Immature Grans (Abs): 0 10*3/uL (ref 0.0–0.1)
Immature Granulocytes: 0 %
Lymphocytes Absolute: 2.1 10*3/uL (ref 0.7–3.1)
Lymphs: 25 %
MCH: 29.1 pg (ref 26.6–33.0)
MCHC: 32.7 g/dL (ref 31.5–35.7)
MCV: 89 fL (ref 79–97)
Monocytes Absolute: 0.6 10*3/uL (ref 0.1–0.9)
Monocytes: 7 %
Neutrophils Absolute: 5.6 10*3/uL (ref 1.4–7.0)
Neutrophils: 67 %
Platelets: 177 10*3/uL (ref 150–450)
RBC: 3.92 x10E6/uL (ref 3.77–5.28)
RDW: 14 % (ref 11.7–15.4)
WBC: 8.5 10*3/uL (ref 3.4–10.8)

## 2020-04-23 LAB — LIPID PANEL W/O CHOL/HDL RATIO
Cholesterol, Total: 212 mg/dL — ABNORMAL HIGH (ref 100–199)
HDL: 31 mg/dL — ABNORMAL LOW (ref >39)
LDL Chol Calc (NIH): 118 mg/dL — ABNORMAL HIGH (ref 0–99)
Triglycerides: 357 mg/dL — ABNORMAL HIGH (ref 0–149)
VLDL Cholesterol Cal: 63 mg/dL — ABNORMAL HIGH (ref 5–40)

## 2020-04-23 MED ORDER — ROSUVASTATIN CALCIUM 40 MG PO TABS: 40.0000 mg | ORAL_TABLET | Freq: Every day | ORAL | 1 refills | Status: DC

## 2020-10-26 ENCOUNTER — Other Ambulatory Visit: Payer: Self-pay | Admitting: Family Medicine

## 2020-10-26 DIAGNOSIS — F331 Major depressive disorder, recurrent, moderate: Secondary | ICD-10-CM

## 2020-10-26 NOTE — Telephone Encounter (Signed)
Courtesy refill. Patient needs to be seen for further refills. Requested Prescriptions  Pending Prescriptions Disp Refills  . sertraline (ZOLOFT) 100 MG tablet [Pharmacy Med Name: SERTRALINE 100MG  TABLETS] 45 tablet 0    Sig: TAKE 1 AND 1/2 TABLETS(150 MG) BY MOUTH DAILY     Psychiatry:  Antidepressants - SSRI Failed - 10/26/2020  2:16 PM      Failed - Valid encounter within last 6 months    Recent Outpatient Visits          6 months ago Benign hypertensive renal disease   [facility] [place], Megan P, DO   1 year ago Moderate episode of recurrent major depressive disorder [facility])   [facility], [place]   1 year ago Moderate episode of recurrent major depressive disorder [facility])   [facility] [place]   2 years ago Asthma due to environmental allergies   [facility] M, PA-C   3 years ago Essential hypertension   [facility] [place], [place], DO             Passed - Completed PHQ-2 or PHQ-9 in the last 360 days

## 2021-02-09 ENCOUNTER — Encounter: Payer: Self-pay | Admitting: Nurse Practitioner

## 2021-02-09 ENCOUNTER — Other Ambulatory Visit: Payer: Self-pay

## 2021-02-09 ENCOUNTER — Ambulatory Visit: Payer: BC Managed Care – PPO

## 2021-02-09 ENCOUNTER — Telehealth (INDEPENDENT_AMBULATORY_CARE_PROVIDER_SITE_OTHER): Payer: BC Managed Care – PPO | Admitting: Nurse Practitioner

## 2021-02-09 DIAGNOSIS — J45909 Unspecified asthma, uncomplicated: Secondary | ICD-10-CM

## 2021-02-09 DIAGNOSIS — U071 COVID-19: Secondary | ICD-10-CM | POA: Diagnosis not present

## 2021-02-09 MED ORDER — BENZONATATE 100 MG PO CAPS
100.0000 mg | ORAL_CAPSULE | Freq: Three times a day (TID) | ORAL | 0 refills | Status: DC | PRN
Start: 2021-02-09 — End: 2021-03-15

## 2021-02-09 MED ORDER — MONTELUKAST SODIUM 10 MG PO TABS: 10.0000 mg | ORAL_TABLET | Freq: Every day | ORAL | 1 refills | Status: DC

## 2021-02-09 NOTE — Assessment & Plan Note (Signed)
Asthma very well controlled with singulair. Refill sent to the pharmacy.

## 2021-02-09 NOTE — Progress Notes (Signed)
Acute Office Visit  Subjective:    Patient ID: Breanna Rosario, female    DOB: [birth year], 45 y.o.   MRN: [id number]  Chief Complaint  Patient presents with   Covid Positive    Tested positive yesterday with a home test. Symptoms sore throat, cough, headache, and tired    HPI Patient is in today for cough and headache that started Sunday. She took a covid-19 test yesterday and it was positive.   UPPER RESPIRATORY TRACT INFECTION Worst symptom: headache Fever: no Cough: yes Shortness of breath: no Wheezing: no Chest pain: no Chest tightness: no Chest congestion: no Nasal congestion: no Runny nose: no Post nasal drip: no Sneezing: no Sore throat: yes Swollen glands: no Sinus pressure: no Headache: yes Face pain: no Toothache: no Ear pain: no  Ear pressure: no  Eyes red/itching:no Eye drainage/crusting: no  Vomiting: no Rash: no Fatigue: yes Sick contacts: no Strep contacts: no  Context: better Recurrent sinusitis: no Relief with OTC cold/cough medications:  mild   Treatments attempted:  tylenol     Past Medical History:  Diagnosis Date   Allergic rhinitis    Heart attack (HCC) 03/2015   Hypertension     Past Surgical History:  Procedure Laterality Date   CARDIAC CATHETERIZATION Right 03/17/2015   Procedure: Left Heart Cath and Coronary Angiography;  Surgeon: Laurier Nancy, MD;  Location: [facility] INVASIVE CV LAB;  Service: Cardiovascular;  Laterality: Right;   CARDIAC CATHETERIZATION N/A 03/18/2015   Procedure: Coronary Stent Intervention;  Surgeon: Alwyn Pea, MD;  Location: [facility] INVASIVE CV LAB;  Service: Cardiovascular;  Laterality: N/A;   None      Family History  Problem Relation Age of Onset   Coronary artery disease Father        Died at age 70; four-vessel bypass at 45 years of age   Hypertension Father    Arthritis Mother    Cancer Mother    Migraines Mother     Social History   Socioeconomic History   Marital status: Single     Spouse name: Not on file   Number of children: Not on file   Years of education: Not on file   Highest education level: Not on file  Occupational History   Not on file  Tobacco Use   Smoking status: Former   Smokeless tobacco: Never  Vaping Use   Vaping Use: Never used  Substance and Sexual Activity   Alcohol use: No   Drug use: No   Sexual activity: Yes    Birth control/protection: None  Other Topics Concern   Not on file  Social History Narrative   Not on file   Social Determinants of Health   Financial Resource Strain: Not on file  Food Insecurity: Not on file  Transportation Needs: Not on file  Physical Activity: Not on file  Stress: Not on file  Social Connections: Not on file  Intimate Partner Violence: Not on file    Outpatient Medications Prior to Visit  Medication Sig Dispense Refill   albuterol (VENTOLIN HFA) 108 (90 Base) MCG/ACT inhaler Inhale 2 puffs into the lungs every 6 (six) hours as needed for wheezing or shortness of breath. 18 g 3   amLODipine-benazepril (LOTREL) 5-10 MG capsule Take 1 capsule by mouth daily. 90 capsule 1   aspirin EC 81 MG tablet Take 81 mg by mouth daily.     clopidogrel (PLAVIX) 75 MG tablet Take 1 tablet (75 mg total) by mouth daily  with breakfast. 180 tablet 1   metoprolol succinate (TOPROL-XL) 50 MG 24 hr tablet Take 1 tablet (50 mg total) by mouth daily. Take with or immediately following a meal. 90 tablet 1   rosuvastatin (CRESTOR) 40 MG tablet Take 1 tablet (40 mg total) by mouth daily. 90 tablet 1   sertraline (ZOLOFT) 100 MG tablet TAKE 1 AND 1/2 TABLETS(150 MG) BY MOUTH DAILY 45 tablet 0   montelukast (SINGULAIR) 10 MG tablet TAKE 1 TABLET(10 MG) BY MOUTH AT BEDTIME 90 tablet 3   fluticasone furoate-vilanterol (BREO ELLIPTA) 100-25 MCG/INH AEPB INHALE 1 PUFF INTO THE LUNGS DAILY (Patient not taking: Reported on 02/09/2021) 3 each 6   nitroGLYCERIN (NITROSTAT) 0.4 MG SL tablet Place under the tongue.     No  facility-administered medications prior to visit.    No Known Allergies  Review of Systems  Constitutional:  Positive for fatigue. Negative for fever.  HENT:  Positive for sore throat. Negative for congestion and ear pain.   Respiratory:  Positive for cough. Negative for shortness of breath.   Cardiovascular: Negative.   Gastrointestinal: Negative.   Genitourinary: Negative.   Musculoskeletal: Negative.   Skin: Negative.       Objective:    Physical Exam Vitals and nursing note reviewed.  Constitutional:      General: She is not in acute distress.    Appearance: Normal appearance.  HENT:     Head: Normocephalic.  Eyes:     Conjunctiva/sclera: Conjunctivae normal.  Pulmonary:     Effort: Pulmonary effort is normal.     Comments: Able to talk in complete sentences Neurological:     Mental Status: She is alert and oriented to person, place, and time.  Psychiatric:        Mood and Affect: Mood normal.        Behavior: Behavior normal.        Thought Content: Thought content normal.        Judgment: Judgment normal.    There were no vitals taken for this visit. Wt Readings from Last 3 Encounters:  04/22/20 (!) 325 lb 3.2 oz (147.5 kg)  08/29/19 (!) 318 lb (144.2 kg)  01/08/19 (!) 315 lb (142.9 kg)    Health Maintenance Due  Topic Date Due   INFLUENZA VACCINE  01/18/2021    There are no preventive care reminders to display for this patient.   Lab Results  Component Value Date   TSH 3.070 07/31/2018   Lab Results  Component Value Date   WBC 8.5 04/22/2020   HGB 11.4 04/22/2020   HCT 34.9 04/22/2020   MCV 89 04/22/2020   PLT 177 04/22/2020   Lab Results  Component Value Date   NA 141 04/22/2020   K 4.4 04/22/2020   CO2 20 04/22/2020   GLUCOSE 90 04/22/2020   BUN 19 04/22/2020   CREATININE 0.86 04/22/2020   BILITOT <0.2 04/22/2020   ALKPHOS 111 04/22/2020   AST 24 04/22/2020   ALT 31 04/22/2020   PROT 6.6 04/22/2020   ALBUMIN 3.6 (L) 04/22/2020    CALCIUM 8.7 04/22/2020   ANIONGAP 6 03/19/2015   Lab Results  Component Value Date   CHOL 212 (H) 04/22/2020   Lab Results  Component Value Date   HDL 31 (L) 04/22/2020   Lab Results  Component Value Date   LDLCALC 118 (H) 04/22/2020   Lab Results  Component Value Date   TRIG 357 (H) 04/22/2020   Lab Results  Component Value  Date   CHOLHDL 8.4 03/17/2015   Lab Results  Component Value Date   HGBA1C 5.3 03/17/2015       Assessment & Plan:   Problem List Items Addressed This Visit       Respiratory   Asthma due to environmental allergies    Asthma very well controlled with singulair. Refill sent to the pharmacy.       Relevant Medications   montelukast (SINGULAIR) 10 MG tablet     Other   COVID-19 - Primary    Positive home test yesterday, symptoms started on Sunday, however she feels significantly better today. She is not vaccinated and does have risk factors for severe illness. She declines molnupiravir or paxlovid today. Will send in tessalon prn cough. Encouraged to rest and increase fluids. She has albuterol inhaler prn sob/wheezing. Discussed isolation for 5 days and to wear a mask the next 5 days. Note work provided. F/U for worsening symptoms or any concerns.         Meds ordered this encounter  Medications   montelukast (SINGULAIR) 10 MG tablet    Sig: Take 1 tablet (10 mg total) by mouth at bedtime.    Dispense:  90 tablet    Refill:  1   benzonatate (TESSALON) 100 MG capsule    Sig: Take 1-2 capsules (100-200 mg total) by mouth 3 (three) times daily as needed for cough.    Dispense:  30 capsule    Refill:  0    This visit was completed via MyChart due to the restrictions of the COVID-19 pandemic. All issues as above were discussed and addressed. Physical exam was done as above through visual confirmation on MyChart. If it was felt that the patient should be evaluated in the office, they were directed there. The patient verbally consented to this  visit. Location of the patient: home Location of the provider: work Those involved with this call:  Provider: Rodman Pickle, DNP CMA: Tristan Schroeder, CMA Front Desk/Registration:  Darrol Angel   Time spent on call:  15 minutes with patient face to face via video conference. More than 50% of this time was spent in counseling and coordination of care. 10 minutes total spent in review of patient's record and preparation of their chart.   Gerre Scull, NP

## 2021-02-09 NOTE — Assessment & Plan Note (Signed)
Positive home test yesterday, symptoms started on Sunday, however she feels significantly better today. She is not vaccinated and does have risk factors for severe illness. She declines molnupiravir or paxlovid today. Will send in tessalon prn cough. Encouraged to rest and increase fluids. She has albuterol inhaler prn sob/wheezing. Discussed isolation for 5 days and to wear a mask the next 5 days. Note work provided. F/U for worsening symptoms or any concerns.

## 2021-02-10 LAB — NOVEL CORONAVIRUS, NAA: SARS-CoV-2, NAA: DETECTED — AB

## 2021-02-10 LAB — SARS-COV-2, NAA 2 DAY TAT

## 2021-03-15 ENCOUNTER — Other Ambulatory Visit: Payer: Self-pay | Admitting: Nurse Practitioner

## 2021-03-15 ENCOUNTER — Other Ambulatory Visit: Payer: Self-pay | Admitting: Family Medicine

## 2021-03-15 DIAGNOSIS — F331 Major depressive disorder, recurrent, moderate: Secondary | ICD-10-CM

## 2021-03-15 NOTE — Telephone Encounter (Signed)
Requested medications are due for refill today.  yes  Requested medications are on the active medications list.  yes  Last refill. 10/26/2020  Future visit scheduled.   no  Notes to clinic.  Pt was given a courtesy refill. She was seen for COVID. Has not had an OV for 9 months.

## 2021-03-24 ENCOUNTER — Telehealth: Payer: Self-pay | Admitting: Family Medicine

## 2021-03-24 DIAGNOSIS — F331 Major depressive disorder, recurrent, moderate: Secondary | ICD-10-CM

## 2021-03-25 NOTE — Telephone Encounter (Signed)
I called pt regarding needing an appt for 6 mo. Check up.   I left a voicemail for her to call [facility] back and and make an appt for her refills.   I sent the sertraline refill to the practice because she has already been given a courtesy refill.

## 2021-03-26 MED ORDER — SERTRALINE HCL 100 MG PO TABS: ORAL_TABLET | ORAL | 0 refills | Status: DC

## 2021-03-26 NOTE — Telephone Encounter (Signed)
Pt called and made an appt for next week.  She would like a weeks refill since she has been out several days already  [employer]

## 2021-03-26 NOTE — Telephone Encounter (Signed)
Call attempted NA voicemail is full.

## 2021-03-26 NOTE — Addendum Note (Signed)
Addended by: Larae Grooms on: 03/26/2021 02:13 PM   Modules accepted: Orders

## 2021-03-29 NOTE — Progress Notes (Signed)
BP 139/83   Pulse 79   Temp 97.9 F (36.6 C) (Oral)   Ht 5' 4.6" (1.641 m)   Wt (!) 334 lb (151.5 kg)   SpO2 97%   BMI 56.27 kg/m    Subjective:    Patient ID: Breanna Rosario, female    DOB: [birth year], 45 y.o.   MRN: [id number]  HPI: Breanna Rosario is a 45 y.o. female  Chief Complaint  Patient presents with   Depression    HYPERTENSION / HYPERLIPIDEMIA Satisfied with current treatment? no Duration of hypertension: years BP monitoring frequency: not checking BP range:  BP medication side effects: no Past BP meds:   Metoprolol 80m and amlodipine/benazepril Duration of hyperlipidemia: years Cholesterol medication side effects: no Cholesterol supplements: none Past cholesterol medications: rosuvastatin (crestor) Medication compliance: poor compliance Aspirin: no Recent stressors: no Recurrent headaches: no Visual changes: no Palpitations: no Dyspnea: no Chest pain: no Lower extremity edema: no Dizzy/lightheaded: no  DEPRESSION/ANXIETY Patient states she feels like the Zoloft is working well for her.  Denies SI.     Relevant past medical, surgical, family and social history reviewed and updated as indicated. Interim medical history since our last visit reviewed. Allergies and medications reviewed and updated.  Review of Systems  Eyes:  Negative for visual disturbance.  Respiratory:  Negative for cough, chest tightness and shortness of breath.   Cardiovascular:  Negative for chest pain, palpitations and leg swelling.  Neurological:  Negative for dizziness and headaches.  Psychiatric/Behavioral:  Positive for dysphoric mood. Negative for suicidal ideas. The patient is nervous/anxious.    Per HPI unless specifically indicated above     Objective:    BP 139/83   Pulse 79   Temp 97.9 F (36.6 C) (Oral)   Ht 5' 4.6" (1.641 m)   Wt (!) 334 lb (151.5 kg)   SpO2 97%   BMI 56.27 kg/m   Wt Readings from Last 3 Encounters:  03/30/21 (!) 334 lb (151.5  kg)  04/22/20 (!) 325 lb 3.2 oz (147.5 kg)  08/29/19 (!) 318 lb (144.2 kg)    Physical Exam Vitals and nursing note reviewed.  Constitutional:      General: She is not in acute distress.    Appearance: Normal appearance. She is obese. She is not ill-appearing, toxic-appearing or diaphoretic.  HENT:     Head: Normocephalic.     Right Ear: External ear normal.     Left Ear: External ear normal.     Nose: Nose normal.     Mouth/Throat:     Mouth: Mucous membranes are moist.     Pharynx: Oropharynx is clear.  Eyes:     General:        Right eye: No discharge.        Left eye: No discharge.     Extraocular Movements: Extraocular movements intact.     Conjunctiva/sclera: Conjunctivae normal.     Pupils: Pupils are equal, round, and reactive to light.  Cardiovascular:     Rate and Rhythm: Normal rate and regular rhythm.     Heart sounds: No murmur heard. Pulmonary:     Effort: Pulmonary effort is normal. No respiratory distress.     Breath sounds: Normal breath sounds. No wheezing or rales.  Musculoskeletal:     Cervical back: Normal range of motion and neck supple.  Skin:    General: Skin is warm and dry.     Capillary Refill: Capillary refill takes less than 2 seconds.  Neurological:     General: No focal deficit present.     Mental Status: She is alert and oriented to person, place, and time. Mental status is at baseline.  Psychiatric:        Mood and Affect: Mood normal.        Behavior: Behavior normal.        Thought Content: Thought content normal.        Judgment: Judgment normal.    Results for orders placed or performed in visit on 02/09/21  Novel Coronavirus, NAA ([facility])   Specimen: Nasopharyngeal(NP) swabs in vial transport medium  Result Value Ref Range   SARS-CoV-2, NAA Detected (A) Not Detected  SARS-COV-2, NAA 2 DAY TAT  Result Value Ref Range   SARS-CoV-2, NAA 2 DAY TAT Performed       Assessment & Plan:   Problem List Items Addressed This Visit        Genitourinary   Benign hypertensive renal disease    Chronic.  Controlled.  Continue with current medication regimen of Amlodipine 93m, Benzapril 183mand Metoprolol 5034maily.  Labs ordered today.  Return to clinic in 6 months for reevaluation.  Call sooner if concerns arise.          Other   Major depression, recurrent ([facility]    Chronic.  Controlled despite PHQ9 score.  Feels like Zoloft 100m71m a good dose for her.  Continue with current medication regimen of Zoloft 100mg44mabs ordered today.  Return to clinic in 6 months for reevaluation.  Call sooner if concerns arise.        Relevant Medications   sertraline (ZOLOFT) 100 MG tablet   Other Relevant Orders   Comp Met (CMET)   Lipid Profile   Mixed hyperlipidemia    Chronic.  Controlled.  Continue with current medication regimen Crestor 40mg 69my.  Labs ordered today.  Return to clinic in 6 months for reevaluation.  Call sooner if concerns arise.        Relevant Medications   amLODipine-benazepril (LOTREL) 5-10 MG capsule   metoprolol succinate (TOPROL-XL) 50 MG 24 hr tablet   rosuvastatin (CRESTOR) 40 MG tablet   Other Relevant Orders   Comp Met (CMET)   Lipid Profile     Follow up plan: Return in about 6 months (around 09/28/2021) for Physical and Fasting labs.

## 2021-03-30 ENCOUNTER — Other Ambulatory Visit: Payer: Self-pay

## 2021-03-30 ENCOUNTER — Encounter: Payer: Self-pay | Admitting: Nurse Practitioner

## 2021-03-30 ENCOUNTER — Ambulatory Visit: Payer: BC Managed Care – PPO | Admitting: Nurse Practitioner

## 2021-03-30 VITALS — BP 139/83 | HR 79 | Temp 97.9°F | Ht 64.6 in | Wt 334.0 lb

## 2021-03-30 DIAGNOSIS — I129 Hypertensive chronic kidney disease with stage 1 through stage 4 chronic kidney disease, or unspecified chronic kidney disease: Secondary | ICD-10-CM

## 2021-03-30 DIAGNOSIS — F331 Major depressive disorder, recurrent, moderate: Secondary | ICD-10-CM

## 2021-03-30 DIAGNOSIS — R7989 Other specified abnormal findings of blood chemistry: Secondary | ICD-10-CM

## 2021-03-30 DIAGNOSIS — E782 Mixed hyperlipidemia: Secondary | ICD-10-CM | POA: Diagnosis not present

## 2021-03-30 MED ORDER — AMLODIPINE BESY-BENAZEPRIL HCL 5-10 MG PO CAPS: 1.0000 | ORAL_CAPSULE | Freq: Every day | ORAL | 1 refills | Status: DC

## 2021-03-30 MED ORDER — CLOPIDOGREL BISULFATE 75 MG PO TABS: 75.0000 mg | ORAL_TABLET | Freq: Every day | ORAL | 1 refills | Status: DC

## 2021-03-30 MED ORDER — SERTRALINE HCL 100 MG PO TABS: ORAL_TABLET | ORAL | 1 refills | Status: DC

## 2021-03-30 MED ORDER — ROSUVASTATIN CALCIUM 40 MG PO TABS: 40.0000 mg | ORAL_TABLET | Freq: Every day | ORAL | 1 refills | Status: DC

## 2021-03-30 MED ORDER — METOPROLOL SUCCINATE ER 50 MG PO TB24: 50.0000 mg | ORAL_TABLET | Freq: Every day | ORAL | 1 refills | Status: DC

## 2021-03-30 NOTE — Assessment & Plan Note (Signed)
Chronic.  Controlled.  Continue with current medication regimen of Amlodipine 5mg , Benzapril 10mg  and Metoprolol 50mg  daily.  Labs ordered today.  Return to clinic in 6 months for reevaluation.  Call sooner if concerns arise.

## 2021-03-30 NOTE — Assessment & Plan Note (Signed)
Chronic.  Controlled despite PHQ9 score.  Feels like Zoloft 100mg  is a good dose for her.  Continue with current medication regimen of Zoloft 100mg .  Labs ordered today.  Return to clinic in 6 months for reevaluation.  Call sooner if concerns arise.

## 2021-03-30 NOTE — Assessment & Plan Note (Signed)
Chronic.  Controlled.  Continue with current medication regimen Crestor 40mg  daily.  Labs ordered today.  Return to clinic in 6 months for reevaluation.  Call sooner if concerns arise.

## 2021-03-31 LAB — COMPREHENSIVE METABOLIC PANEL
ALT: 24 IU/L (ref 0–32)
AST: 22 IU/L (ref 0–40)
Albumin/Globulin Ratio: 1.2 (ref 1.2–2.2)
Albumin: 3.8 g/dL (ref 3.8–4.8)
Alkaline Phosphatase: 114 IU/L (ref 44–121)
BUN/Creatinine Ratio: 17 (ref 9–23)
BUN: 20 mg/dL (ref 6–24)
Bilirubin Total: 0.2 mg/dL (ref 0.0–1.2)
CO2: 21 mmol/L (ref 20–29)
Calcium: 9.1 mg/dL (ref 8.7–10.2)
Chloride: 108 mmol/L — ABNORMAL HIGH (ref 96–106)
Creatinine, Ser: 1.19 mg/dL — ABNORMAL HIGH (ref 0.57–1.00)
Globulin, Total: 3.1 g/dL (ref 1.5–4.5)
Glucose: 127 mg/dL — ABNORMAL HIGH (ref 70–99)
Potassium: 4.5 mmol/L (ref 3.5–5.2)
Sodium: 145 mmol/L — ABNORMAL HIGH (ref 134–144)
Total Protein: 6.9 g/dL (ref 6.0–8.5)
eGFR: 57 mL/min/{1.73_m2} — ABNORMAL LOW (ref >59)

## 2021-03-31 LAB — LIPID PANEL
Chol/HDL Ratio: 5.1 ratio — ABNORMAL HIGH (ref 0.0–4.4)
Cholesterol, Total: 143 mg/dL (ref 100–199)
HDL: 28 mg/dL — ABNORMAL LOW (ref >39)
LDL Chol Calc (NIH): 73 mg/dL (ref 0–99)
Triglycerides: 254 mg/dL — ABNORMAL HIGH (ref 0–149)
VLDL Cholesterol Cal: 42 mg/dL — ABNORMAL HIGH (ref 5–40)

## 2021-03-31 NOTE — Progress Notes (Signed)
Please let patient know that her lab work shows that her kidney function has declined some.  This may be due to some dehydration at the time the lab was drawn. I would like to repeat this next week to make sure it returns to normal.  Please have her come in for a lab visit.  Patient's cholesterol has improved from prior.  Continue with a low fat diet.  We will repeat that at her physical in 6 months.

## 2021-03-31 NOTE — Addendum Note (Signed)
Addended by: Larae Grooms on: 03/31/2021 07:56 AM   Modules accepted: Orders

## 2021-04-15 ENCOUNTER — Other Ambulatory Visit: Payer: Self-pay

## 2021-04-15 ENCOUNTER — Other Ambulatory Visit: Payer: BC Managed Care – PPO

## 2021-04-15 DIAGNOSIS — R7989 Other specified abnormal findings of blood chemistry: Secondary | ICD-10-CM

## 2021-04-16 LAB — COMPREHENSIVE METABOLIC PANEL
ALT: 29 IU/L (ref 0–32)
AST: 28 IU/L (ref 0–40)
Albumin/Globulin Ratio: 1.4 (ref 1.2–2.2)
Albumin: 4.1 g/dL (ref 3.8–4.8)
Alkaline Phosphatase: 117 IU/L (ref 44–121)
BUN/Creatinine Ratio: 20 (ref 9–23)
BUN: 20 mg/dL (ref 6–24)
Bilirubin Total: 0.2 mg/dL (ref 0.0–1.2)
CO2: 20 mmol/L (ref 20–29)
Calcium: 8.8 mg/dL (ref 8.7–10.2)
Chloride: 107 mmol/L — ABNORMAL HIGH (ref 96–106)
Creatinine, Ser: 1.01 mg/dL — ABNORMAL HIGH (ref 0.57–1.00)
Globulin, Total: 3 g/dL (ref 1.5–4.5)
Glucose: 90 mg/dL (ref 70–99)
Potassium: 4.9 mmol/L (ref 3.5–5.2)
Sodium: 142 mmol/L (ref 134–144)
Total Protein: 7.1 g/dL (ref 6.0–8.5)
eGFR: 70 mL/min/{1.73_m2} (ref >59)

## 2021-04-16 NOTE — Progress Notes (Signed)
Hi Nichele.  Your kidney function improved.  We need to continue to monitor this.  Make sure you are well hydrated.  I would like to see you again in 3 months to make sure your kidney function remains improved.  Please make a follow up appt. Please let me know if you have any questions.

## 2021-05-12 ENCOUNTER — Other Ambulatory Visit: Payer: Self-pay | Admitting: Family Medicine

## 2021-05-12 NOTE — Telephone Encounter (Signed)
Discontinued 04/23/20. Not on current med profile

## 2021-06-28 NOTE — Progress Notes (Signed)
BP 117/75    Pulse 96    Temp 98 F (36.7 C) (Oral)    Wt (!) 324 lb 12.8 oz (147.3 kg)    SpO2 99%    BMI 54.72 kg/m    Subjective:    Patient ID: Breanna Rosario, female    DOB: 19-Jul-1975, 46 y.o.   MRN: [id number]  HPI: DONNISHA BESECKER is a 46 y.o. female  Chief Complaint  Patient presents with   Cough    Pt states she has had an ongoing cough for the last few months. States she has been sick on and off for the past few months. States she feels like the congestion is settled in her lungs.    UPPER RESPIRATORY TRACT INFECTION Worst symptom: patient states she has been on and off sick for the last few months.  Latest episode started over the weekend.  Fever: no Cough: yes Shortness of breath: yes- when she is coughing Wheezing: yes Chest pain: no Chest tightness: no Chest congestion: no Nasal congestion: yes Runny nose: no Post nasal drip: yes Sneezing: no Sore throat: no Swollen glands: no Sinus pressure: yes Headache: no Face pain: no Toothache: no Ear pain: no bilateral Ear pressure: no bilateral Eyes red/itching:no Eye drainage/crusting: no  Vomiting: no Rash: no Fatigue: yes Sick contacts: no Strep contacts: no  Context: fluctuating Recurrent sinusitis: no Relief with OTC cold/cough medications: no  Treatments attempted: none   Relevant past medical, surgical, family and social history reviewed and updated as indicated. Interim medical history since our last visit reviewed. Allergies and medications reviewed and updated.  Review of Systems  Constitutional:  Positive for fatigue. Negative for fever.  HENT:  Positive for congestion and sinus pressure. Negative for dental problem, ear pain, postnasal drip, rhinorrhea, sinus pain, sneezing and sore throat.   Respiratory:  Positive for cough, shortness of breath and wheezing.   Cardiovascular:  Negative for chest pain.  Gastrointestinal:  Negative for vomiting.  Skin:  Negative for rash.   Neurological:  Negative for headaches.   Per HPI unless specifically indicated above     Objective:    BP 117/75    Pulse 96    Temp 98 F (36.7 C) (Oral)    Wt (!) 324 lb 12.8 oz (147.3 kg)    SpO2 99%    BMI 54.72 kg/m   Wt Readings from Last 3 Encounters:  06/29/21 (!) 324 lb 12.8 oz (147.3 kg)  03/30/21 (!) 334 lb (151.5 kg)  04/22/20 (!) 325 lb 3.2 oz (147.5 kg)    Physical Exam Vitals and nursing note reviewed.  Constitutional:      General: She is not in acute distress.    Appearance: Normal appearance. She is normal weight. She is not ill-appearing, toxic-appearing or diaphoretic.  HENT:     Head: Normocephalic.     Right Ear: External ear normal.     Left Ear: External ear normal.     Nose: Congestion present.     Mouth/Throat:     Mouth: Mucous membranes are moist.     Pharynx: Oropharynx is clear. Posterior oropharyngeal erythema present. No oropharyngeal exudate.  Eyes:     General:        Right eye: No discharge.        Left eye: No discharge.     Extraocular Movements: Extraocular movements intact.     Conjunctiva/sclera: Conjunctivae normal.     Pupils: Pupils are equal, round, and reactive to light.  Cardiovascular:     Rate and Rhythm: Normal rate and regular rhythm.     Heart sounds: No murmur heard. Pulmonary:     Effort: Pulmonary effort is normal. No respiratory distress.     Breath sounds: Wheezing present. No rales.  Musculoskeletal:     Cervical back: Normal range of motion and neck supple.  Skin:    General: Skin is warm and dry.     Capillary Refill: Capillary refill takes less than 2 seconds.  Neurological:     General: No focal deficit present.     Mental Status: She is alert and oriented to person, place, and time. Mental status is at baseline.  Psychiatric:        Mood and Affect: Mood normal.        Behavior: Behavior normal.        Thought Content: Thought content normal.        Judgment: Judgment normal.    Results for orders  placed or performed in visit on 04/15/21  Comp Met (CMET)  Result Value Ref Range   Glucose 90 70 - 99 mg/dL   BUN 20 6 - 24 mg/dL   Creatinine, Ser 1.01 (H) 0.57 - 1.00 mg/dL   eGFR 70 >59 mL/min/1.73   BUN/Creatinine Ratio 20 9 - 23   Sodium 142 134 - 144 mmol/L   Potassium 4.9 3.5 - 5.2 mmol/L   Chloride 107 (H) 96 - 106 mmol/L   CO2 20 20 - 29 mmol/L   Calcium 8.8 8.7 - 10.2 mg/dL   Total Protein 7.1 6.0 - 8.5 g/dL   Albumin 4.1 3.8 - 4.8 g/dL   Globulin, Total 3.0 1.5 - 4.5 g/dL   Albumin/Globulin Ratio 1.4 1.2 - 2.2   Bilirubin Total 0.2 0.0 - 1.2 mg/dL   Alkaline Phosphatase 117 44 - 121 IU/L   AST 28 0 - 40 IU/L   ALT 29 0 - 32 IU/L      Assessment & Plan:   Problem List Items Addressed This Visit   None Visit Diagnoses     Viral upper respiratory tract infection    -  Primary   Will send prednisone and zpak. Can use cough medicine with codeine PRN. Recommend albuterol PRN for SOB and wheezing. Discussed s/s to follow up for.   Relevant Medications   azithromycin (ZITHROMAX) 250 MG tablet        Follow up plan: Return if symptoms worsen or fail to improve.

## 2021-06-29 ENCOUNTER — Encounter: Payer: Self-pay | Admitting: Nurse Practitioner

## 2021-06-29 ENCOUNTER — Ambulatory Visit: Payer: BC Managed Care – PPO | Admitting: Nurse Practitioner

## 2021-06-29 ENCOUNTER — Other Ambulatory Visit: Payer: Self-pay

## 2021-06-29 VITALS — BP 117/75 | HR 96 | Temp 98.0°F | Wt 324.8 lb

## 2021-06-29 DIAGNOSIS — J069 Acute upper respiratory infection, unspecified: Secondary | ICD-10-CM | POA: Diagnosis not present

## 2021-06-29 MED ORDER — AZITHROMYCIN 250 MG PO TABS: ORAL_TABLET | ORAL | 0 refills | Status: AC

## 2021-06-29 MED ORDER — PREDNISONE 10 MG PO TABS: 10.0000 mg | ORAL_TABLET | Freq: Every day | ORAL | 0 refills | Status: DC

## 2021-06-29 MED ORDER — GUAIFENESIN-CODEINE 100-10 MG/5ML PO SOLN: 5.0000 mL | Freq: Every evening | ORAL | 0 refills | Status: DC | PRN

## 2021-07-14 ENCOUNTER — Encounter: Payer: Self-pay | Admitting: Nurse Practitioner

## 2021-09-16 ENCOUNTER — Other Ambulatory Visit: Payer: Self-pay | Admitting: Nurse Practitioner

## 2021-09-16 DIAGNOSIS — J45909 Unspecified asthma, uncomplicated: Secondary | ICD-10-CM

## 2021-09-16 DIAGNOSIS — F331 Major depressive disorder, recurrent, moderate: Secondary | ICD-10-CM

## 2021-09-17 NOTE — Telephone Encounter (Signed)
Requested medication (s) are due for refill today - yes ? ?Requested medication (s) are on the active medication list -yes ? ?Future visit scheduled -yes ? ?Last refill: 03/30/22 #135 1RF ? ?Notes to clinic: Request RF: non delegated Rx ? ?Requested Prescriptions  ?Pending Prescriptions Disp Refills  ? sertraline (ZOLOFT) 100 MG tablet [Pharmacy Med Name: SERTRALINE 100MG TABLETS] 135 tablet 1  ?  Sig: TAKE 1 AND 1/2 TABLETS(150 MG) BY MOUTH DAILY  ?  ? Not Delegated - Psychiatry:  Antidepressants - SSRI - sertraline Failed - 09/16/2021 10:43 PM  ?  ?  Failed - This refill cannot be delegated  ?  ?  Passed - AST in normal range and within 360 days  ?  AST  ?Date Value Ref Range Status  ?04/15/2021 28 0 - 40 IU/L Final  ?  ?  ?  ?  Passed - ALT in normal range and within 360 days  ?  ALT  ?Date Value Ref Range Status  ?04/15/2021 29 0 - 32 IU/L Final  ?  ?  ?  ?  Passed - Completed PHQ-2 or PHQ-9 in the last 360 days  ?  ?  Passed - Valid encounter within last 6 months  ?  Recent Outpatient Visits   ? ?      ? 2 months ago Viral upper respiratory tract infection  ? [place], NP  ? 5 months ago Moderate episode of recurrent major depressive disorder ([place])  ? [place], NP  ? 7 months ago COVID-19  ? [place], NP  ? 1 year ago Benign hypertensive renal disease  ? [place], [place], DO  ? 2 years ago Moderate episode of recurrent major depressive disorder ([place])  ? [place], [place], [place]  ? ?  ?  ?Future Appointments   ? ?        ? In 2 weeks Jon Billings, NP [facility], [facility]  ? ?  ? ?  ?  ?  ?Signed Prescriptions Disp Refills  ? amLODipine-benazepril (LOTREL) 5-10 MG capsule 90 capsule 0  ?  Sig: TAKE 1 CAPSULE BY MOUTH DAILY  ?  ? Cardiovascular: CCB + ACEI Combos Failed - 09/16/2021 10:43 PM  ?  ?  Failed - Cr in normal range and within 180 days  ?   Creatinine, Ser  ?Date Value Ref Range Status  ?04/15/2021 1.01 (H) 0.57 - 1.00 mg/dL Final  ?  ?  ?  ?  Passed - K in normal range and within 180 days  ?  Potassium  ?Date Value Ref Range Status  ?04/15/2021 4.9 3.5 - 5.2 mmol/L Final  ?  ?  ?  ?  Passed - Na in normal range and within 180 days  ?  Sodium  ?Date Value Ref Range Status  ?04/15/2021 142 134 - 144 mmol/L Final  ?  ?  ?  ?  Passed - eGFR is 30 or above and within 180 days  ?  GFR calc Af Amer  ?Date Value Ref Range Status  ?04/22/2020 95 >59 mL/min/1.73 Final  ?  Comment:  ?  **In accordance with recommendations from the NKF-ASN Task force,** ?  [facility] is in the process of updating its eGFR calculation to the ?  2021 CKD-EPI creatinine equation that estimates kidney function ?  without a race variable. ?  ? ?GFR calc non  Af Amer  ?Date Value Ref Range Status  ?04/22/2020 82 >59 mL/min/1.73 Final  ? ?eGFR  ?Date Value Ref Range Status  ?04/15/2021 70 >59 mL/min/1.73 Final  ?  ?  ?  ?  Passed - Patient is not pregnant  ?  ?  Passed - Last BP in normal range  ?  BP Readings from Last 1 Encounters:  ?06/29/21 117/75  ?  ?  ?  ?  Passed - Valid encounter within last 6 months  ?  Recent Outpatient Visits   ? ?      ? 2 months ago Viral upper respiratory tract infection  ? [place], NP  ? 5 months ago Moderate episode of recurrent major depressive disorder ([place])  ? [place], NP  ? 7 months ago COVID-19  ? [place], NP  ? 1 year ago Benign hypertensive renal disease  ? [place], [place], DO  ? 2 years ago Moderate episode of recurrent major depressive disorder ([place])  ? [place], [place], [place]  ? ?  ?  ?Future Appointments   ? ?        ? In 2 weeks Jon Billings, NP [facility], [facility]  ? ?  ? ?  ?  ?  ? ? ? ?Requested Prescriptions  ?Pending Prescriptions Disp Refills  ? sertraline (ZOLOFT)  100 MG tablet [Pharmacy Med Name: SERTRALINE 100MG TABLETS] 135 tablet 1  ?  Sig: TAKE 1 AND 1/2 TABLETS(150 MG) BY MOUTH DAILY  ?  ? Not Delegated - Psychiatry:  Antidepressants - SSRI - sertraline Failed - 09/16/2021 10:43 PM  ?  ?  Failed - This refill cannot be delegated  ?  ?  Passed - AST in normal range and within 360 days  ?  AST  ?Date Value Ref Range Status  ?04/15/2021 28 0 - 40 IU/L Final  ?  ?  ?  ?  Passed - ALT in normal range and within 360 days  ?  ALT  ?Date Value Ref Range Status  ?04/15/2021 29 0 - 32 IU/L Final  ?  ?  ?  ?  Passed - Completed PHQ-2 or PHQ-9 in the last 360 days  ?  ?  Passed - Valid encounter within last 6 months  ?  Recent Outpatient Visits   ? ?      ? 2 months ago Viral upper respiratory tract infection  ? [place], NP  ? 5 months ago Moderate episode of recurrent major depressive disorder ([place])  ? [place], NP  ? 7 months ago COVID-19  ? [place], NP  ? 1 year ago Benign hypertensive renal disease  ? [place], [place], DO  ? 2 years ago Moderate episode of recurrent major depressive disorder ([place])  ? [place], [place], [place]  ? ?  ?  ?Future Appointments   ? ?        ? In 2 weeks Jon Billings, NP [facility], [facility]  ? ?  ? ?  ?  ?  ?Signed Prescriptions Disp Refills  ? amLODipine-benazepril (LOTREL) 5-10 MG capsule 90 capsule 0  ?  Sig: TAKE 1 CAPSULE BY MOUTH DAILY  ?  ? Cardiovascular: CCB + ACEI Combos Failed - 09/16/2021 10:43 PM  ?  ?  Failed - Cr in normal range and within 180 days  ?  Creatinine, Ser  ?Date Value Ref Range Status  ?04/15/2021 1.01 (H) 0.57 - 1.00 mg/dL Final  ?  ?  ?  ?  Passed - K in normal range and within 180 days  ?  Potassium  ?Date Value Ref Range Status  ?04/15/2021 4.9 3.5 - 5.2 mmol/L Final  ?  ?  ?  ?  Passed - Na in normal range and within 180 days  ?  Sodium  ?Date Value  Ref Range Status  ?04/15/2021 142 134 - 144 mmol/L Final  ?  ?  ?  ?  Passed - eGFR is 30 or above and within 180 days  ?  GFR calc Af Amer  ?Date Value Ref Range Status  ?04/22/2020 95 >59 mL/min/1.73 Final  ?  Comment:  ?  **In accordance with recommendations from the NKF-ASN Task force,** ?  [facility] is in the process of updating its eGFR calculation to the ?  2021 CKD-EPI creatinine equation that estimates kidney function ?  without a race variable. ?  ? ?GFR calc non Af Amer  ?Date Value Ref Range Status  ?04/22/2020 82 >59 mL/min/1.73 Final  ? ?eGFR  ?Date Value Ref Range Status  ?04/15/2021 70 >59 mL/min/1.73 Final  ?  ?  ?  ?  Passed - Patient is not pregnant  ?  ?  Passed - Last BP in normal range  ?  BP Readings from Last 1 Encounters:  ?06/29/21 117/75  ?  ?  ?  ?  Passed - Valid encounter within last 6 months  ?  Recent Outpatient Visits   ? ?      ? 2 months ago Viral upper respiratory tract infection  ? [place], NP  ? 5 months ago Moderate episode of recurrent major depressive disorder ([place])  ? [place], NP  ? 7 months ago COVID-19  ? [place], NP  ? 1 year ago Benign hypertensive renal disease  ? [place], [place], DO  ? 2 years ago Moderate episode of recurrent major depressive disorder ([place])  ? [place], [place], [place]  ? ?  ?  ?Future Appointments   ? ?        ? In 2 weeks Jon Billings, NP [facility], [facility]  ? ?  ? ?  ?  ?  ? ? ? ?

## 2021-09-17 NOTE — Telephone Encounter (Signed)
Requested Prescriptions  ?Pending Prescriptions Disp Refills  ?? amLODipine-benazepril (LOTREL) 5-10 MG capsule [Pharmacy Med Name: AMLODIPINE-BENAZ 5/10MG CAPSULES] 90 capsule 0  ?  Sig: TAKE 1 CAPSULE BY MOUTH DAILY  ?  ? Cardiovascular: CCB + ACEI Combos Failed - 09/16/2021 10:43 PM  ?  ?  Failed - Cr in normal range and within 180 days  ?  Creatinine, Ser  ?Date Value Ref Range Status  ?04/15/2021 1.01 (H) 0.57 - 1.00 mg/dL Final  ?   ?  ?  Passed - K in normal range and within 180 days  ?  Potassium  ?Date Value Ref Range Status  ?04/15/2021 4.9 3.5 - 5.2 mmol/L Final  ?   ?  ?  Passed - Na in normal range and within 180 days  ?  Sodium  ?Date Value Ref Range Status  ?04/15/2021 142 134 - 144 mmol/L Final  ?   ?  ?  Passed - eGFR is 30 or above and within 180 days  ?  GFR calc Af Amer  ?Date Value Ref Range Status  ?04/22/2020 95 >59 mL/min/1.73 Final  ?  Comment:  ?  **In accordance with recommendations from the NKF-ASN Task force,** ?  [facility] is in the process of updating its eGFR calculation to the ?  2021 CKD-EPI creatinine equation that estimates kidney function ?  without a race variable. ?  ? ?GFR calc non Af Amer  ?Date Value Ref Range Status  ?04/22/2020 82 >59 mL/min/1.73 Final  ? ?eGFR  ?Date Value Ref Range Status  ?04/15/2021 70 >59 mL/min/1.73 Final  ?   ?  ?  Passed - Patient is not pregnant  ?  ?  Passed - Last BP in normal range  ?  BP Readings from Last 1 Encounters:  ?06/29/21 117/75  ?   ?  ?  Passed - Valid encounter within last 6 months  ?  Recent Outpatient Visits   ?      ? 2 months ago Viral upper respiratory tract infection  ? [place], NP  ? 5 months ago Moderate episode of recurrent major depressive disorder ([place])  ? [place], NP  ? 7 months ago COVID-19  ? [place], NP  ? 1 year ago Benign hypertensive renal disease  ? [place], [place], DO  ? 2 years ago  Moderate episode of recurrent major depressive disorder ([place])  ? [place], [place], [place]  ?  ?  ?Future Appointments   ?        ? In 2 weeks Jon Billings, NP [facility], [facility]  ?  ? ?  ?  ?  ?? sertraline (ZOLOFT) 100 MG tablet [Pharmacy Med Name: SERTRALINE 100MG TABLETS] 135 tablet 1  ?  Sig: TAKE 1 AND 1/2 TABLETS(150 MG) BY MOUTH DAILY  ?  ? Not Delegated - Psychiatry:  Antidepressants - SSRI - sertraline Failed - 09/16/2021 10:43 PM  ?  ?  Failed - This refill cannot be delegated  ?  ?  Passed - AST in normal range and within 360 days  ?  AST  ?Date Value Ref Range Status  ?04/15/2021 28 0 - 40 IU/L Final  ?   ?  ?  Passed - ALT in normal range and within 360 days  ?  ALT  ?Date Value Ref Range Status  ?04/15/2021 29 0 - 32 IU/L  Final  ?   ?  ?  Passed - Completed PHQ-2 or PHQ-9 in the last 360 days  ?  ?  Passed - Valid encounter within last 6 months  ?  Recent Outpatient Visits   ?      ? 2 months ago Viral upper respiratory tract infection  ? [place], NP  ? 5 months ago Moderate episode of recurrent major depressive disorder ([place])  ? [place], NP  ? 7 months ago COVID-19  ? [place], NP  ? 1 year ago Benign hypertensive renal disease  ? [place], [place], DO  ? 2 years ago Moderate episode of recurrent major depressive disorder ([place])  ? [place], [place], [place]  ?  ?  ?Future Appointments   ?        ? In 2 weeks Jon Billings, NP [facility], [facility]  ?  ? ?  ?  ?  ? ?

## 2021-09-28 ENCOUNTER — Encounter: Payer: BC Managed Care – PPO | Admitting: Nurse Practitioner

## 2021-10-06 ENCOUNTER — Encounter: Payer: BC Managed Care – PPO | Admitting: Nurse Practitioner

## 2021-12-27 ENCOUNTER — Other Ambulatory Visit: Payer: Self-pay | Admitting: Nurse Practitioner

## 2021-12-28 ENCOUNTER — Encounter: Payer: Self-pay | Admitting: *Deleted

## 2021-12-28 NOTE — Telephone Encounter (Signed)
Voicemail left to schedule appt so meds can be refilled.

## 2021-12-28 NOTE — Telephone Encounter (Signed)
Lvm asking patient to call back to schedule an appointment 

## 2021-12-28 NOTE — Telephone Encounter (Signed)
Requested medication (s) are due for refill today: yes  Requested medication (s) are on the active medication list: yes  Last refill:  Amlodipine-benazapril  09/17/21 #90 with 0 RF, Crestor 03/30/21 #90 with 1 RF  Future visit scheduled: no, canceled in April, unable to reach, voicemail and MyChart messages left  Notes to clinic:  please assess, needs appt and can not reach.      Requested Prescriptions  Pending Prescriptions Disp Refills   amLODipine-benazepril (LOTREL) 5-10 MG capsule [Pharmacy Med Name: AMLODIPINE-BENAZ 5/10MG CAPSULES] 90 capsule 0    Sig: TAKE 1 CAPSULE BY MOUTH DAILY     Cardiovascular: CCB + ACEI Combos Failed - 12/27/2021  2:41 PM      Failed - Cr in normal range and within 180 days    Creatinine, Ser  Date Value Ref Range Status  04/15/2021 1.01 (H) 0.57 - 1.00 mg/dL Final         Failed - K in normal range and within 180 days    Potassium  Date Value Ref Range Status  04/15/2021 4.9 3.5 - 5.2 mmol/L Final         Failed - Na in normal range and within 180 days    Sodium  Date Value Ref Range Status  04/15/2021 142 134 - 144 mmol/L Final         Failed - eGFR is 30 or above and within 180 days    GFR calc Af Amer  Date Value Ref Range Status  04/22/2020 95 >59 mL/min/1.73 Final    Comment:    **In accordance with recommendations from the NKF-ASN Task force,**   [facility] is in the process of updating its eGFR calculation to the   2021 CKD-EPI creatinine equation that estimates kidney function   without a race variable.    GFR calc non Af Amer  Date Value Ref Range Status  04/22/2020 82 >59 mL/min/1.73 Final   eGFR  Date Value Ref Range Status  04/15/2021 70 >59 mL/min/1.73 Final         Failed - Valid encounter within last 6 months    Recent Outpatient Visits           6 months ago Viral upper respiratory tract infection   [facility] Jon Billings, NP   9 months ago Moderate episode of recurrent major depressive  disorder ([place])   [facility] Jon Billings, NP   10 months ago COVID-19   [facility], Scheryl Darter, NP   1 year ago Benign hypertensive renal disease   [facility] [place], [place], DO   2 years ago Moderate episode of recurrent major depressive disorder [facility])   [place], [place], [place]              Passed - Patient is not pregnant      Passed - Last BP in normal range    BP Readings from Last 1 Encounters:  06/29/21 117/75          rosuvastatin (CRESTOR) 40 MG tablet [Pharmacy Med Name: ROSUVASTATIN 40MG TABLETS] 90 tablet 1    Sig: TAKE 1 TABLET(40 MG) BY MOUTH DAILY     Cardiovascular:  Antilipid - Statins 2 Failed - 12/27/2021  2:41 PM      Failed - Cr in normal range and within 360 days    Creatinine, Ser  Date Value Ref Range Status  04/15/2021 1.01 (H) 0.57 - 1.00 mg/dL Final  Failed - Lipid Panel in normal range within the last 12 months    Cholesterol, Total  Date Value Ref Range Status  03/30/2021 143 100 - 199 mg/dL Final   LDL Chol Calc (NIH)  Date Value Ref Range Status  03/30/2021 73 0 - 99 mg/dL Final   HDL  Date Value Ref Range Status  03/30/2021 28 (L) >39 mg/dL Final   Triglycerides  Date Value Ref Range Status  03/30/2021 254 (H) 0 - 149 mg/dL Final         Passed - Patient is not pregnant      Passed - Valid encounter within last 12 months    Recent Outpatient Visits           6 months ago Viral upper respiratory tract infection   [facility] Jon Billings, NP   9 months ago Moderate episode of recurrent major depressive disorder ([place])   [place], Karen, NP   10 months ago COVID-19   [facility], Lauren A, NP   1 year ago Benign hypertensive renal disease   [facility] [place], [place], DO   2 years ago Moderate episode of recurrent major depressive disorder [facility])    [facility] [place], [place]

## 2021-12-28 NOTE — Telephone Encounter (Signed)
Patient is overdue for routine f/up. Was due 09/28/21 for a CPE per Karen's last note. Please call and schedule visit then route to provider to refill until appointment.

## 2021-12-29 NOTE — Telephone Encounter (Signed)
2nd attempt to reach patient by phone 

## 2022-01-06 ENCOUNTER — Encounter: Payer: Self-pay | Admitting: Nurse Practitioner

## 2022-01-06 NOTE — Telephone Encounter (Signed)
3rd attempt to reach patient by phone. Unable to leave vm to schedule an appointment.

## 2022-05-26 ENCOUNTER — Ambulatory Visit: Payer: Self-pay | Admitting: *Deleted

## 2022-05-26 NOTE — Telephone Encounter (Signed)
Pt scheduled tomorrow at 10:20

## 2022-05-26 NOTE — Telephone Encounter (Signed)
  Chief Complaint: right foot pain Symptoms: right foot pain and swelling Frequency: chronic- but this episode started Tuesday Pertinent Negatives: Patient denies fever, chest pain, difficulty breathing, calf pain Disposition: [] ED /[] Urgent Care (no appt availability in office) / [x] Appointment(In office/virtual)/ []  [place] [facility]/ [] Home Care/ [] Refused Recommended Disposition /[] [place] [facility]/ []  Follow-up with PCP Additional Notes: Appointment has been scheduled but it is outside disposition- patient is in severe pain- limping- advised UC- patient states she can not afford UC visit- call to office- requested call for review

## 2022-05-26 NOTE — Telephone Encounter (Signed)
Reason for Disposition  SEVERE swelling (e.g., can't move swollen ankle at all)  Answer Assessment - Initial Assessment Questions 1. LOCATION: "Which ankle is swollen?" "Where is the swelling?"     Right foot/ankle- it stays swollen 2. ONSET: "When did the swelling start?"     Ankles- chronic- arch of foot painful- Tuesday 3. SWELLING: "How bad is the swelling?" Or, "How large is it?" (e.g., mild, moderate, severe; size of localized swelling)    - NONE: No joint swelling.   - LOCALIZED: Localized; small area of puffy or swollen skin (e.g., insect bite, skin irritation).   - MILD: Joint looks or feels mildly swollen or puffy.   - MODERATE: Swollen; interferes with normal activities (e.g., work or school); decreased range of movement; may be limping.   - SEVERE: Very swollen; can't move swollen joint at all; limping a lot or unable to walk.     moderate 4. PAIN: "Is there any pain?" If Yes, ask: "How bad is it?" (Scale 1-10; or mild, moderate, severe)   - NONE (0): no pain.   - MILD (1-3): doesn't interfere with normal activities.    - MODERATE (4-7): interferes with normal activities (e.g., work or school) or awakens from sleep, limping.    - SEVERE (8-10): excruciating pain, unable to do any normal activities, unable to walk.      Moderate/severe 5. CAUSE: "What do you think caused the ankle swelling?"     unsure 6. OTHER SYMPTOMS: "Do you have any other symptoms?" (e.g., fever, chest pain, difficulty breathing, calf pain)     no  Protocols used: Ankle Swelling-A-AH

## 2022-05-26 NOTE — Telephone Encounter (Signed)
Attempted to call patient to offer an appointment for tomorrow. Unable to leave vm due to vm box being full

## 2022-05-27 ENCOUNTER — Ambulatory Visit: Payer: BC Managed Care – PPO | Admitting: Nurse Practitioner

## 2022-05-27 ENCOUNTER — Ambulatory Visit
Admission: RE | Admit: 2022-05-27 | Discharge: 2022-05-27 | Disposition: A | Discharge status: Home / Self Care | Payer: BC Managed Care – PPO | Attending: Nurse Practitioner | Admitting: Nurse Practitioner

## 2022-05-27 ENCOUNTER — Ambulatory Visit
Admission: RE | Admit: 2022-05-27 | Discharge: 2022-05-27 | Disposition: A | Discharge status: Home / Self Care | Payer: BC Managed Care – PPO | Source: Ambulatory Visit | Attending: Nurse Practitioner | Admitting: Nurse Practitioner

## 2022-05-27 ENCOUNTER — Encounter: Payer: Self-pay | Admitting: Nurse Practitioner

## 2022-05-27 VITALS — BP 121/80 | HR 84 | Temp 97.7°F | Wt 321.7 lb

## 2022-05-27 DIAGNOSIS — M79671 Pain in right foot: Secondary | ICD-10-CM | POA: Diagnosis present

## 2022-05-27 DIAGNOSIS — F331 Major depressive disorder, recurrent, moderate: Secondary | ICD-10-CM

## 2022-05-27 DIAGNOSIS — Z0000 Encounter for general adult medical examination without abnormal findings: Secondary | ICD-10-CM | POA: Diagnosis not present

## 2022-05-27 DIAGNOSIS — J45909 Unspecified asthma, uncomplicated: Secondary | ICD-10-CM | POA: Diagnosis not present

## 2022-05-27 DIAGNOSIS — E782 Mixed hyperlipidemia: Secondary | ICD-10-CM

## 2022-05-27 DIAGNOSIS — Z6841 Body Mass Index (BMI) 40.0 and over, adult: Secondary | ICD-10-CM

## 2022-05-27 DIAGNOSIS — Z1231 Encounter for screening mammogram for malignant neoplasm of breast: Secondary | ICD-10-CM

## 2022-05-27 DIAGNOSIS — Z1211 Encounter for screening for malignant neoplasm of colon: Secondary | ICD-10-CM

## 2022-05-27 DIAGNOSIS — I129 Hypertensive chronic kidney disease with stage 1 through stage 4 chronic kidney disease, or unspecified chronic kidney disease: Secondary | ICD-10-CM

## 2022-05-27 MED ORDER — PREDNISONE 10 MG PO TABS: 10.0000 mg | ORAL_TABLET | Freq: Every day | ORAL | 0 refills | Status: DC

## 2022-05-27 MED ORDER — SERTRALINE HCL 100 MG PO TABS: ORAL_TABLET | ORAL | 1 refills | Status: DC

## 2022-05-27 MED ORDER — CLOPIDOGREL BISULFATE 75 MG PO TABS: 75.0000 mg | ORAL_TABLET | Freq: Every day | ORAL | 1 refills | Status: DC

## 2022-05-27 MED ORDER — MONTELUKAST SODIUM 10 MG PO TABS: ORAL_TABLET | ORAL | 1 refills | Status: DC

## 2022-05-27 MED ORDER — ROSUVASTATIN CALCIUM 40 MG PO TABS: 40.0000 mg | ORAL_TABLET | Freq: Every day | ORAL | 1 refills | Status: DC

## 2022-05-27 MED ORDER — AMLODIPINE BESY-BENAZEPRIL HCL 5-10 MG PO CAPS: 1.0000 | ORAL_CAPSULE | Freq: Every day | ORAL | 1 refills | Status: DC

## 2022-05-27 MED ORDER — METOPROLOL SUCCINATE ER 50 MG PO TB24: 50.0000 mg | ORAL_TABLET | Freq: Every day | ORAL | 1 refills | Status: DC

## 2022-05-27 NOTE — Progress Notes (Signed)
BP 121/80   Pulse 84   Temp 97.7 F (36.5 C) (Oral)   Wt (!) 321 lb 11.2 oz (145.9 kg)   SpO2 97%   BMI 54.20 kg/m    Subjective:    Patient ID: Breanna Rosario, female    DOB: [birth year], 46 y.o.   MRN: [id number]  HPI: Breanna Rosario is a 46 y.o. female presenting on 05/27/2022 for comprehensive medical examination. Current medical complaints include: Right foot pain  She currently lives with: Menopausal Symptoms: no  Patient presents to clinic with complaints of right foot pain since 2008-2009.  Has been off and on for years.  Pain has worsened this week.  States she cried off and on yesterday.  Second time it has flared up in about 6 weeks which is more often than normal.  She has tried soaking her feet, citric juices (which usually helps), tylenol, ice.  Nothing has helped with improving the pain.  Pain worsens when she is walking on it.   When it is at its worse it is tender to touch and shoots pain up her leg.  She isn't able to bend her toes.   HYPERTENSION / HYPERLIPIDEMIA Satisfied with current treatment? yes Duration of hypertension: years BP monitoring frequency: not checking BP range:  BP medication side effects: no Past BP meds: amlodipine/benazepril Duration of hyperlipidemia: years Cholesterol medication side effects: no Cholesterol supplements: none Past cholesterol medications: rosuvastatin (crestor) Medication compliance: excellent compliance Aspirin: no Recent stressors: no Recurrent headaches: no Visual changes: no Palpitations: no Dyspnea: no Chest pain: no Lower extremity edema: no Dizzy/lightheaded: no  MOOD Feels like she is doing well.  Continues on Zoloft 154m daily.  Denies concerns regarding medication at visit today.  Denies SI.  Depression Screen done today and results listed below:     05/27/2022   10:54 AM 06/29/2021    8:49 AM 03/30/2021   10:48 AM 02/09/2021    8:46 AM 04/22/2020    9:06 AM  Depression screen PHQ 2/9   Decreased Interest _0 Down, Depressed, Hopeless _1 PHQ - 2 Score _2 Altered sleeping _3 0 2  Tired, decreased energy _4 Change in appetite 1 0 0 0 2  Feeling bad or failure about yourself  0 _5 Trouble concentrating _6 Moving slowly or fidgety/restless 0 0 2 0 1  Suicidal thoughts 0 0 0 0 0  PHQ-9 Score _7 Difficult doing work/chores  Somewhat difficult Somewhat difficult Somewhat difficult Somewhat difficult    The patient does not have a history of falls. I did complete a risk assessment for falls. A plan of care for falls was documented.   Past Medical History:  Past Medical History:  Diagnosis Date   Allergic rhinitis    [place]   Hypertension     Surgical History:  Past Surgical History:  Procedure Laterality Date   CARDIAC CATHETERIZATION Right 03/17/2015   Procedure: Left Heart Cath and Coronary Angiography;  Surgeon: SDionisio David MD;  Location: [facility];  Service: Cardiovascular;  Laterality: Right;   CARDIAC CATHETERIZATION N/A 03/18/2015   Procedure: Coronary Stent Intervention;  Surgeon: DYolonda Kida MD;  [facility];  Service: Cardiovascular;  Laterality: N/A;   None  Medications:  Current Outpatient Medications on File Prior to Visit  Medication Sig   albuterol (VENTOLIN HFA) 108 (90 Base) MCG/ACT inhaler Inhale 2 puffs into the lungs every 6 (six) hours as needed for wheezing or shortness of breath.   aspirin EC 81 MG tablet Take 81 mg by mouth daily.   fluticasone furoate-vilanterol (BREO ELLIPTA) 100-25 MCG/INH AEPB INHALE 1 PUFF INTO THE LUNGS DAILY   nitroGLYCERIN (NITROSTAT) 0.4 MG SL tablet Place under the tongue.   No current facility-administered medications on file prior to visit.    Allergies:  No Known Allergies  Social History:  Social History   Socioeconomic History   Marital status: Single    Spouse name: Not on  file   Number of children: Not on file   Years of education: Not on file   Highest education level: Not on file  Occupational History   Not on file  Tobacco Use   Smoking status: Former   Smokeless tobacco: Never  Vaping Use   Vaping Use: Never used  Substance and Sexual Activity   Alcohol use: No   Drug use: No   Sexual activity: Yes    Birth control/protection: None  Other Topics Concern   Not on file  Social History Narrative   Not on file   Social Determinants of Health   Financial Resource Strain: Unknown (04/24/2017)   Overall Financial Resource Strain (CARDIA)    Difficulty of Paying Living Expenses: Patient refused  Food Insecurity: Unknown (04/24/2017)   Hunger Vital Sign    Worried About Running Out of Food in the Last Year: Patient refused    [place] in the Last Year: Patient refused  Transportation Needs: Unknown (04/24/2017)   [place] - Transportation    Lack of Transportation (Medical): Patient refused    Lack of Transportation (Non-Medical): Patient refused  Physical Activity: Unknown (04/24/2017)   Exercise Vital Sign    Days of Exercise per Week: Patient refused    Minutes of Exercise per Session: Patient refused  Stress: Not on file  Social Connections: Unknown (04/24/2017)   Social Connection and Isolation Panel [NHANES]    Frequency of Communication with Friends and Family: Patient refused    Frequency of Social Gatherings with Friends and Family: Patient refused    Attends Religious Services: Patient refused    Active Member of Clubs or Organizations: Patient refused    Attends Archivist Meetings: Patient refused    Marital Status: Patient refused  Intimate Partner Violence: Unknown (04/24/2017)   Humiliation, Afraid, Rape, and Kick questionnaire    Fear of Current or Ex-Partner: Patient refused    Emotionally Abused: Patient refused    Physically Abused: Patient refused    Sexually Abused: Patient refused   Social History    Tobacco Use  Smoking Status Former  Smokeless Tobacco Never   Social History   Substance and Sexual Activity  Alcohol Use No    Family History:  Family History  Problem Relation Age of Onset   Coronary artery disease Father        Died at age 20; four-vessel bypass at 46 years of age   Hypertension Father    Arthritis Mother    Cancer Mother    Migraines Mother     Past medical history, surgical history, medications, allergies, family history and social history reviewed with patient today and changes made to appropriate areas of the chart.   Review of Systems  Eyes:  Negative for  blurred vision and double vision.  Respiratory:  Negative for shortness of breath.   Cardiovascular:  Negative for chest pain, palpitations and leg swelling.  Musculoskeletal:        Right foot pain and swelling  Neurological:  Negative for dizziness and headaches.  Psychiatric/Behavioral:  Positive for depression. Negative for suicidal ideas. The patient is nervous/anxious.    All other ROS negative except what is listed above and in the HPI.      Objective:    BP 121/80   Pulse 84   Temp 97.7 F (36.5 C) (Oral)   Wt (!) 321 lb 11.2 oz (145.9 kg)   SpO2 97%   BMI 54.20 kg/m   Wt Readings from Last 3 Encounters:  05/27/22 (!) 321 lb 11.2 oz (145.9 kg)  06/29/21 (!) 324 lb 12.8 oz (147.3 kg)  03/30/21 (!) 334 lb (151.5 kg)    Physical Exam Vitals and nursing note reviewed.  Constitutional:      General: She is awake. She is not in acute distress.    Appearance: Normal appearance. She is well-developed. She is not ill-appearing.  HENT:     Head: Normocephalic and atraumatic.     Right Ear: Hearing, tympanic membrane, ear canal and external ear normal. No drainage.     Left Ear: Hearing, tympanic membrane, ear canal and external ear normal. No drainage.     Nose: Nose normal.     Right Sinus: No maxillary sinus tenderness or frontal sinus tenderness.     Left Sinus: No maxillary  sinus tenderness or frontal sinus tenderness.     Mouth/Throat:     Mouth: Mucous membranes are moist.     Pharynx: Oropharynx is clear. Uvula midline. No pharyngeal swelling, oropharyngeal exudate or posterior oropharyngeal erythema.  Eyes:     General: Lids are normal.        Right eye: No discharge.        Left eye: No discharge.     Extraocular Movements: Extraocular movements intact.     Conjunctiva/sclera: Conjunctivae normal.     Pupils: Pupils are equal, round, and reactive to light.     Visual Fields: Right eye visual fields normal and left eye visual fields normal.  Neck:     Thyroid: No thyromegaly.     Vascular: No carotid bruit.     Trachea: Trachea normal.  Cardiovascular:     Rate and Rhythm: Normal rate and regular rhythm.     Heart sounds: Normal heart sounds. No murmur heard.    No gallop.  Pulmonary:     Effort: Pulmonary effort is normal. No accessory muscle usage or respiratory distress.     Breath sounds: Normal breath sounds.  Chest:  Breasts:    Right: Normal.     Left: Normal.  Abdominal:     General: Bowel sounds are normal.     Palpations: Abdomen is soft. There is no hepatomegaly or splenomegaly.     Tenderness: There is no abdominal tenderness.  Musculoskeletal:     Cervical back: Normal range of motion and neck supple.     Right lower leg: No edema.     Left lower leg: No edema.     Right foot: Decreased range of motion. Normal capillary refill. Swelling and tenderness present. No bony tenderness.  Feet:     Right foot:     Skin integrity: Skin integrity normal.     Left foot:     Skin integrity: Skin integrity normal.  Lymphadenopathy:     Head:     Right side of head: No submental, submandibular, tonsillar, preauricular or posterior auricular adenopathy.     Left side of head: No submental, submandibular, tonsillar, preauricular or posterior auricular adenopathy.     Cervical: No cervical adenopathy.     Upper Body:     Right upper body:  No supraclavicular, axillary or pectoral adenopathy.     Left upper body: No supraclavicular, axillary or pectoral adenopathy.  Skin:    General: Skin is warm and dry.     Capillary Refill: Capillary refill takes less than 2 seconds.     Findings: No rash.  Neurological:     Mental Status: She is alert and oriented to person, place, and time.     Gait: Gait is intact.  Psychiatric:        Attention and Perception: Attention normal.        Mood and Affect: Mood normal.        Speech: Speech normal.        Behavior: Behavior normal. Behavior is cooperative.        Thought Content: Thought content normal.        Judgment: Judgment normal.     Results for orders placed or performed in visit on 04/15/21  Comp Met (CMET)  Result Value Ref Range   Glucose 90 70 - 99 mg/dL   BUN 20 6 - 24 mg/dL   Creatinine, Ser 1.01 (H) 0.57 - 1.00 mg/dL   eGFR 70 >59 mL/min/1.73   BUN/Creatinine Ratio 20 9 - 23   Sodium 142 134 - 144 mmol/L   Potassium 4.9 3.5 - 5.2 mmol/L   Chloride 107 (H) 96 - 106 mmol/L   CO2 20 20 - 29 mmol/L   Calcium 8.8 8.7 - 10.2 mg/dL   Total Protein 7.1 6.0 - 8.5 g/dL   Albumin 4.1 3.8 - 4.8 g/dL   Globulin, Total 3.0 1.5 - 4.5 g/dL   Albumin/Globulin Ratio 1.4 1.2 - 2.2   Bilirubin Total 0.2 0.0 - 1.2 mg/dL   Alkaline Phosphatase 117 44 - 121 IU/L   AST 28 0 - 40 IU/L   ALT 29 0 - 32 IU/L      Assessment & Plan:   Problem List Items Addressed This Visit       Respiratory   Asthma due to environmental allergies    Chronic.  Controlled.  Continue with current medication regimen of Singulair 59m.  Refills sent today.  Labs ordered today.  Return to clinic in 6 months for reevaluation.  Call sooner if concerns arise.        Relevant Medications   predniSONE (DELTASONE) 10 MG tablet   montelukast (SINGULAIR) 10 MG tablet     Genitourinary   Benign hypertensive renal disease    Chronic.  Controlled.  Continue with current medication regimen of  Amlodipine/Benzapril.  Refills sent today.  Labs ordered today.  Return to clinic in 6 months for reevaluation.  Call sooner if concerns arise.        Relevant Orders   Comprehensive metabolic panel     Other   Major depression, recurrent (HCC)    Chronic.  Controlled.  Continue with current medication regimen of Zoloft 1584mdaily.  Refills sent today.  Labs ordered today.  Return to clinic in 6 months for reevaluation.  Call sooner if concerns arise.        Relevant Medications   sertraline (ZOLOFT) 100 MG  tablet   Class 3 severe obesity due to excess calories with body mass index (BMI) of 50.0 to 59.9 in adult [facility])    Recommended eating smaller high protein, low fat meals more frequently and exercising 30 mins a day 5 times a week with a goal of 10-15lb weight loss in the next 3 months. Patient voiced their understanding and motivation to adhere to these recommendations.       Mixed hyperlipidemia    Chronic.  Controlled.  Continue with current medication regimen.  Refills sent today.  Labs ordered today.  Return to clinic in 6 months for reevaluation.  Call sooner if concerns arise.        Relevant Medications   amLODipine-benazepril (LOTREL) 5-10 MG capsule   metoprolol succinate (TOPROL-XL) 50 MG 24 hr tablet   rosuvastatin (CRESTOR) 40 MG tablet   Other Relevant Orders   Lipid panel   Other Visit Diagnoses     Annual physical exam    -  Primary   Health maintenance reviewed during visit today.  Labs ordered.  Vaccines up to date.  Needs a PAP. Referral for Colonoscopy placed.   Relevant Orders   CBC with Differential/Platelet   Comprehensive metabolic panel   Lipid panel   TSH   Urinalysis, Routine w reflex microscopic   Right foot pain       Relevant Medications   predniSONE (DELTASONE) 10 MG tablet   Other Relevant Orders   DG Foot Complete Right   Uric acid   Screening for colon cancer       Relevant Orders   Ambulatory referral to Gastroenterology    Encounter for screening mammogram for malignant neoplasm of breast       Relevant Orders   MM 3D SCREEN BREAST BILATERAL        Follow up plan: Return in about 6 months (around 11/26/2022) for HTN, HLD, DM2 FU.   LABORATORY TESTING:  - Pap smear:  Will get at next visit  IMMUNIZATIONS:   - Tdap: Tetanus vaccination status reviewed: last tetanus booster within 10 years. - Influenza: Refused - Pneumovax: Not applicable - Prevnar: Not applicable - COVID: Not applicable - HPV: Not applicable - Shingrix vaccine: Not applicable  SCREENING: -Mammogram: Ordered today   - Colonoscopy: Ordered today  - Bone Density: Not applicable  -Hearing Test: Not applicable  -Spirometry: Not applicable   PATIENT COUNSELING:   Advised to take 1 mg of folate supplement per day if capable of pregnancy.   Sexuality: Discussed sexually transmitted diseases, partner selection, use of condoms, avoidance of unintended pregnancy  and contraceptive alternatives.   Advised to avoid cigarette smoking.  I discussed with the patient that most people either abstain from alcohol or drink within safe limits (<=14/week and <=4 drinks/occasion for males, <=7/weeks and <= 3 drinks/occasion for females) and that the risk for alcohol disorders and other health effects rises proportionally with the number of drinks per week and how often a drinker exceeds daily limits.  Discussed cessation/primary prevention of drug use and availability of treatment for abuse.   Diet: Encouraged to adjust caloric intake to maintain  or achieve ideal body weight, to reduce intake of dietary saturated fat and total fat, to limit sodium intake by avoiding high sodium foods and not adding table salt, and to maintain adequate dietary potassium and calcium preferably from fresh fruits, vegetables, and low-fat dairy products.    stressed the importance of regular exercise  Injury prevention: Discussed safety belts, safety  helmets, smoke  detector, smoking near bedding or upholstery.   Dental health: Discussed importance of regular tooth brushing, flossing, and dental visits.    NEXT PREVENTATIVE PHYSICAL DUE IN 1 YEAR. Return in about 6 months (around 11/26/2022) for HTN, HLD, DM2 FU.

## 2022-05-27 NOTE — Assessment & Plan Note (Signed)
Chronic.  Controlled.  Continue with current medication regimen of Amlodipine/Benzapril.  Refills sent today.  Labs ordered today.  Return to clinic in 6 months for reevaluation.  Call sooner if concerns arise.

## 2022-05-27 NOTE — Assessment & Plan Note (Signed)
Chronic.  Controlled.  Continue with current medication regimen.  Refills sent today.  Labs ordered today.  Return to clinic in 6 months for reevaluation.  Call sooner if concerns arise.  ° °

## 2022-05-27 NOTE — Assessment & Plan Note (Signed)
Chronic.  Controlled.  Continue with current medication regimen of Singulair 10mg .  Refills sent today.  Labs ordered today.  Return to clinic in 6 months for reevaluation.  Call sooner if concerns arise.

## 2022-05-27 NOTE — Assessment & Plan Note (Signed)
Recommended eating smaller high protein, low fat meals more frequently and exercising 30 mins a day 5 times a week with a goal of 10-15lb weight loss in the next 3 months. Patient voiced their understanding and motivation to adhere to these recommendations.  

## 2022-05-27 NOTE — Assessment & Plan Note (Signed)
Chronic.  Controlled.  Continue with current medication regimen of Zoloft 150mg daily.  Refills sent today.  Labs ordered today.  Return to clinic in 6 months for reevaluation.  Call sooner if concerns arise.  

## 2022-05-28 LAB — COMPREHENSIVE METABOLIC PANEL
ALT: 27 IU/L (ref 0–32)
AST: 31 IU/L (ref 0–40)
Albumin/Globulin Ratio: 1.3 (ref 1.2–2.2)
Albumin: 4 g/dL (ref 3.9–4.9)
Alkaline Phosphatase: 93 IU/L (ref 44–121)
BUN/Creatinine Ratio: 28 — ABNORMAL HIGH (ref 9–23)
BUN: 32 mg/dL — ABNORMAL HIGH (ref 6–24)
Bilirubin Total: 0.2 mg/dL (ref 0.0–1.2)
CO2: 19 mmol/L — ABNORMAL LOW (ref 20–29)
Calcium: 8.9 mg/dL (ref 8.7–10.2)
Chloride: 106 mmol/L (ref 96–106)
Creatinine, Ser: 1.13 mg/dL — ABNORMAL HIGH (ref 0.57–1.00)
Globulin, Total: 3 g/dL (ref 1.5–4.5)
Glucose: 136 mg/dL — ABNORMAL HIGH (ref 70–99)
Potassium: 4.8 mmol/L (ref 3.5–5.2)
Sodium: 142 mmol/L (ref 134–144)
Total Protein: 7 g/dL (ref 6.0–8.5)
eGFR: 61 mL/min/{1.73_m2} (ref >59)

## 2022-05-28 LAB — URINALYSIS, ROUTINE W REFLEX MICROSCOPIC
Bilirubin, UA: NEGATIVE
Glucose, UA: NEGATIVE
Ketones, UA: NEGATIVE
Nitrite, UA: POSITIVE — AB
RBC, UA: NEGATIVE
Specific Gravity, UA: 1.015 (ref 1.005–1.030)
Urobilinogen, Ur: 0.2 mg/dL (ref 0.2–1.0)
pH, UA: 5.5 (ref 5.0–7.5)

## 2022-05-28 LAB — CBC WITH DIFFERENTIAL/PLATELET
Basophils Absolute: 0 10*3/uL (ref 0.0–0.2)
Basos: 0 %
EOS (ABSOLUTE): 0.1 10*3/uL (ref 0.0–0.4)
Eos: 2 %
Hematocrit: 35.7 % (ref 34.0–46.6)
Hemoglobin: 11.4 g/dL (ref 11.1–15.9)
Immature Grans (Abs): 0 10*3/uL (ref 0.0–0.1)
Immature Granulocytes: 0 %
Lymphocytes Absolute: 1.9 10*3/uL (ref 0.7–3.1)
Lymphs: 24 %
MCH: 28.9 pg (ref 26.6–33.0)
MCHC: 31.9 g/dL (ref 31.5–35.7)
MCV: 90 fL (ref 79–97)
Monocytes Absolute: 0.4 10*3/uL (ref 0.1–0.9)
Monocytes: 4 %
Neutrophils Absolute: 5.6 10*3/uL (ref 1.4–7.0)
Neutrophils: 70 %
Platelets: 228 10*3/uL (ref 150–450)
RBC: 3.95 x10E6/uL (ref 3.77–5.28)
RDW: 12.7 % (ref 11.7–15.4)
WBC: 8.1 10*3/uL (ref 3.4–10.8)

## 2022-05-28 LAB — MICROSCOPIC EXAMINATION
Casts: NONE SEEN /lpf
WBC, UA: >30 /hpf — AB (ref 0–5)

## 2022-05-28 LAB — TSH: TSH: 2.32 u[IU]/mL (ref 0.450–4.500)

## 2022-05-28 LAB — URIC ACID: Uric Acid: 10.8 mg/dL — ABNORMAL HIGH (ref 2.6–6.2)

## 2022-05-28 LAB — LIPID PANEL
Chol/HDL Ratio: 4.2 ratio (ref 0.0–4.4)
Cholesterol, Total: 121 mg/dL (ref 100–199)
HDL: 29 mg/dL — ABNORMAL LOW (ref >39)
LDL Chol Calc (NIH): 56 mg/dL (ref 0–99)
Triglycerides: 223 mg/dL — ABNORMAL HIGH (ref 0–149)
VLDL Cholesterol Cal: 36 mg/dL (ref 5–40)

## 2022-05-30 NOTE — Progress Notes (Signed)
Hi Breanna Rosario.  Your uric acid level is elevated.  The pain in your foot is likely caused by Gout.  The prednisone should take care of it.  If not, please follow up in office so we can discuss further action.  If you have multiple flares close together we will consider starting a maintenance medication.

## 2022-05-30 NOTE — Progress Notes (Signed)
Hi Brya.  You still have a heal spur and one along the talonavicular joint.  These are likely contributing to your pain but not the sole cause.  I recommend seeing Orthopedics for further evaluation and management.

## 2022-05-31 ENCOUNTER — Ambulatory Visit: Payer: BC Managed Care – PPO | Admitting: Family Medicine

## 2022-07-31 ENCOUNTER — Other Ambulatory Visit: Payer: Self-pay | Admitting: Nurse Practitioner

## 2022-07-31 DIAGNOSIS — M79671 Pain in right foot: Secondary | ICD-10-CM

## 2022-08-01 NOTE — Telephone Encounter (Signed)
Requested medication (s) are due for refill today: yes  Requested medication (s) are on the active medication list: yes  Last refill:  06/11/22  Future visit scheduled: yes  Notes to clinic:  Unable to refill per protocol, cannot delegate.      Requested Prescriptions  Pending Prescriptions Disp Refills   predniSONE (DELTASONE) 10 MG tablet [Pharmacy Med Name: PREDNISONE 10MG** TABLETS] 21 tablet 0    Sig: TAKE 6 TABLETS BY MOUTH TODAY WITH BREAKFAST, THEN DECREASE BY 1 TABLET DAILY UNTIL ALL TAKEN     Not Delegated - Endocrinology:  Oral Corticosteroids Failed - 07/31/2022  6:33 PM      Failed - This refill cannot be delegated      Failed - Manual Review: Eye exam for IOP if prolonged treatment      Failed - Glucose (serum) in normal range and within 180 days    Glucose  Date Value Ref Range Status  05/27/2022 136 (H) 70 - 99 mg/dL Final   Glucose, Bld  Date Value Ref Range Status  03/19/2015 93 65 - 99 mg/dL Final         Failed - Bone Mineral Density or Dexa Scan completed in the last 2 years      Passed - K in normal range and within 180 days    Potassium  Date Value Ref Range Status  05/27/2022 4.8 3.5 - 5.2 mmol/L Final         Passed - Na in normal range and within 180 days    Sodium  Date Value Ref Range Status  05/27/2022 142 134 - 144 mmol/L Final         Passed - Last BP in normal range    BP Readings from Last 1 Encounters:  05/27/22 121/80         Passed - Valid encounter within last 6 months    Recent Outpatient Visits           2 months ago Annual physical exam   [place] Jon Billings, NP   1 year ago Viral upper respiratory tract infection   [place] Jon Billings, NP   1 year ago Moderate episode of recurrent major depressive disorder [facility])   [place] Jon Billings, NP   1 year ago [place], NP   2 years ago Benign hypertensive renal disease   [place] [facility] [place], [place], DO

## 2022-08-02 ENCOUNTER — Ambulatory Visit: Payer: Self-pay | Admitting: *Deleted

## 2022-08-02 NOTE — Telephone Encounter (Signed)
  Chief Complaint: Gout flare up in foot Symptoms: pain in joint next to big toe on left foot. Frequency: Since Sat. Pertinent Negatives: Patient denies N/A Disposition: [] ED /[] Urgent Care (no appt availability in office) / [x] Appointment(In office/virtual)/ []  [place] [facility]/ [] Home Care/ [] Refused Recommended Disposition /[] [place] [facility]/ []  Follow-up with PCP Additional Notes: Appt. Made for 08/04/2022 at 3:20 with Jon Billings, NP. I made her aware of the new temporary address on [address].  [place].

## 2022-08-02 NOTE — Telephone Encounter (Signed)
essage from Shan Levans sent at 08/02/2022  9:37 AM EST  Summary: Gout flare   Patient states that she is having a gout flare and is requesting prednisone. Patient would also like to know if pcp can give something additional to help with the flare and a maintenance medication.          Call History   Type Contact Phone/Fax User  08/02/2022 09:34 AM EST Phone (Incoming) [place], [place] (Self) [phone] (W) Mabe, [place] for Estill Bamberg   Reason for Disposition  Pain in the big toe joint  Answer Assessment - Initial Assessment Questions 1. ONSET: "When did the pain start?"      Gout flare up in left foot in toe next to big toe.    I also have gout in my right foot but it's ok.   2. LOCATION: "Where is the pain located?"      Toe next to big toe.    3. PAIN: "How bad is the pain?"    (Scale 1-10; or mild, moderate, severe)  - MILD (1-3): doesn't interfere with normal activities.   - MODERATE (4-7): interferes with normal activities (e.g., work or school) or awakens from sleep, limping.   - SEVERE (8-10): excruciating pain, unable to do any normal activities, unable to walk.      Pain in foot from gout 4. WORK OR EXERCISE: "Has there been any recent work or exercise that involved this part of the body?"      No 5. CAUSE: "What do you think is causing the foot pain?"     Gout flare up 6. OTHER SYMPTOMS: "Do you have any other symptoms?" (e.g., leg pain, rash, fever, numbness)     No 7. PREGNANCY: "Is there any chance you are pregnant?" "When was your last menstrual period?"     Not asked  Protocols used: Foot Pain-A-AH

## 2022-08-04 ENCOUNTER — Ambulatory Visit: Payer: BC Managed Care – PPO | Admitting: Nurse Practitioner

## 2022-08-04 ENCOUNTER — Encounter: Payer: Self-pay | Admitting: Nurse Practitioner

## 2022-08-04 VITALS — BP 109/73 | HR 67 | Temp 99.1°F | Wt 321.4 lb

## 2022-08-04 DIAGNOSIS — M109 Gout, unspecified: Secondary | ICD-10-CM

## 2022-08-04 MED ORDER — PREDNISONE 10 MG PO TABS: 10.0000 mg | ORAL_TABLET | Freq: Every day | ORAL | 0 refills | Status: DC

## 2022-08-04 NOTE — Progress Notes (Signed)
BP 109/73   Pulse 67   Temp 99.1 F (37.3 C) (Oral)   Wt (!) 321 lb 6.4 oz (145.8 kg)   SpO2 98%   BMI 54.15 kg/m    Subjective:    Patient ID: Breanna Rosario, female    DOB: 16-Jan-1976, 47 y.o.   MRN: [id number]  HPI: Breanna Rosario is a 47 y.o. female  Chief Complaint  Patient presents with   Gout    Pt states she thinks she is having a a gout flare in the 2nd toe on her L foot since Saturday    GOUT Duration:days 2nd toe on L foot Right 1st metatarsophalangeal pain: no Left 1st metatarsophalangeal pain: no Right knee pain: no Left knee pain: no Severity: 4/10  Quality: burning and tender Swelling: yes Redness: yes Trauma: no Recent dietary change or indiscretion: no Fevers: no Nausea/vomiting: no Aggravating factors: weight bearing Alleviating factors:  Status:  better Treatments attempted:  Relevant past medical, surgical, family and social history reviewed and updated as indicated. Interim medical history since our last visit reviewed. Allergies and medications reviewed and updated.  Review of Systems  Musculoskeletal:        Left toe pain    Per HPI unless specifically indicated above     Objective:    BP 109/73   Pulse 67   Temp 99.1 F (37.3 C) (Oral)   Wt (!) 321 lb 6.4 oz (145.8 kg)   SpO2 98%   BMI 54.15 kg/m   Wt Readings from Last 3 Encounters:  08/04/22 (!) 321 lb 6.4 oz (145.8 kg)  05/27/22 (!) 321 lb 11.2 oz (145.9 kg)  06/29/21 (!) 324 lb 12.8 oz (147.3 kg)    Physical Exam Vitals and nursing note reviewed.  Constitutional:      General: She is not in acute distress.    Appearance: Normal appearance. She is normal weight. She is not ill-appearing, toxic-appearing or diaphoretic.  HENT:     Head: Normocephalic.     Right Ear: External ear normal.     Left Ear: External ear normal.     Nose: Nose normal.     Mouth/Throat:     Mouth: Mucous membranes are moist.     Pharynx: Oropharynx is clear.  Eyes:     General:         Right eye: No discharge.        Left eye: No discharge.     Extraocular Movements: Extraocular movements intact.     Conjunctiva/sclera: Conjunctivae normal.     Pupils: Pupils are equal, round, and reactive to light.  Cardiovascular:     Rate and Rhythm: Normal rate and regular rhythm.     Heart sounds: No murmur heard. Pulmonary:     Effort: Pulmonary effort is normal. No respiratory distress.     Breath sounds: Normal breath sounds. No wheezing or rales.  Musculoskeletal:     Cervical back: Normal range of motion and neck supple.       Feet:  Feet:     Left foot:     Skin integrity: Erythema and warmth present.  Skin:    General: Skin is warm and dry.     Capillary Refill: Capillary refill takes less than 2 seconds.  Neurological:     General: No focal deficit present.     Mental Status: She is alert and oriented to person, place, and time. Mental status is at baseline.  Psychiatric:  Mood and Affect: Mood normal.        Behavior: Behavior normal.        Thought Content: Thought content normal.        Judgment: Judgment normal.     Results for orders placed or performed in visit on 05/27/22  Microscopic Examination  Result Value Ref Range   WBC, UA >30 (A) 0 - 5 /hpf   RBC, Urine 0-2 0 - 2 /hpf   Epithelial Cells (non renal) 0-10 0 - 10 /hpf   Casts None seen None seen /lpf   Bacteria, UA Many (A) None seen/Few  Uric acid  Result Value Ref Range   Uric Acid 10.8 (H) 2.6 - 6.2 mg/dL  CBC with Differential/Platelet  Result Value Ref Range   WBC 8.1 3.4 - 10.8 x10E3/uL   RBC 3.95 3.77 - 5.28 x10E6/uL   Hemoglobin 11.4 11.1 - 15.9 g/dL   Hematocrit 35.7 34.0 - 46.6 %   MCV 90 79 - 97 fL   MCH 28.9 26.6 - 33.0 pg   MCHC 31.9 31.5 - 35.7 g/dL   RDW 12.7 11.7 - 15.4 %   Platelets 228 150 - 450 x10E3/uL   Neutrophils 70 Not Estab. %   Lymphs 24 Not Estab. %   Monocytes 4 Not Estab. %   Eos 2 Not Estab. %   Basos 0 Not Estab. %   Neutrophils Absolute  5.6 1.4 - 7.0 x10E3/uL   Lymphocytes Absolute 1.9 0.7 - 3.1 x10E3/uL   Monocytes Absolute 0.4 0.1 - 0.9 x10E3/uL   EOS (ABSOLUTE) 0.1 0.0 - 0.4 x10E3/uL   Basophils Absolute 0.0 0.0 - 0.2 x10E3/uL   Immature Granulocytes 0 Not Estab. %   Immature Grans (Abs) 0.0 0.0 - 0.1 x10E3/uL  Comprehensive metabolic panel  Result Value Ref Range   Glucose 136 (H) 70 - 99 mg/dL   BUN 32 (H) 6 - 24 mg/dL   Creatinine, Ser 1.13 (H) 0.57 - 1.00 mg/dL   eGFR 61 >59 mL/min/1.73   BUN/Creatinine Ratio 28 (H) 9 - 23   Sodium 142 134 - 144 mmol/L   Potassium 4.8 3.5 - 5.2 mmol/L   Chloride 106 96 - 106 mmol/L   CO2 19 (L) 20 - 29 mmol/L   Calcium 8.9 8.7 - 10.2 mg/dL   Total Protein 7.0 6.0 - 8.5 g/dL   Albumin 4.0 3.9 - 4.9 g/dL   Globulin, Total 3.0 1.5 - 4.5 g/dL   Albumin/Globulin Ratio 1.3 1.2 - 2.2   Bilirubin Total 0.2 0.0 - 1.2 mg/dL   Alkaline Phosphatase 93 44 - 121 IU/L   AST 31 0 - 40 IU/L   ALT 27 0 - 32 IU/L  Lipid panel  Result Value Ref Range   Cholesterol, Total 121 100 - 199 mg/dL   Triglycerides 223 (H) 0 - 149 mg/dL   HDL 29 (L) >39 mg/dL   VLDL Cholesterol Cal 36 5 - 40 mg/dL   LDL Chol Calc (NIH) 56 0 - 99 mg/dL   Chol/HDL Ratio 4.2 0.0 - 4.4 ratio  TSH  Result Value Ref Range   TSH 2.320 0.450 - 4.500 uIU/mL  Urinalysis, Routine w reflex microscopic  Result Value Ref Range   Specific Gravity, UA 1.015 1.005 - 1.030   pH, UA 5.5 5.0 - 7.5   Color, UA Yellow Yellow   Appearance Ur Cloudy (A) Clear   Leukocytes,UA 2+ (A) Negative   Protein,UA 2+ (A) Negative/Trace   Glucose, UA Negative  Negative   Ketones, UA Negative Negative   RBC, UA Negative Negative   Bilirubin, UA Negative Negative   Urobilinogen, Ur 0.2 0.2 - 1.0 mg/dL   Nitrite, UA Positive (A) Negative   Microscopic Examination See below:       Assessment & Plan:   Problem List Items Addressed This Visit   None Visit Diagnoses     Acute gout involving toe of left foot, unspecified cause    -   Primary   Will treat with prednisone. Will start Allopurinol in two weeks after acute flare has resolved.  Follow up in 2 weeks.  Call sooner if concerns arise.   Relevant Medications   predniSONE (DELTASONE) 10 MG tablet        Follow up plan: Return in about 2 months (around 10/03/2022) for Allopurinol discussion.

## 2022-08-16 ENCOUNTER — Other Ambulatory Visit: Payer: Self-pay | Admitting: Cardiovascular Disease

## 2022-08-16 DIAGNOSIS — E782 Mixed hyperlipidemia: Secondary | ICD-10-CM

## 2022-08-18 ENCOUNTER — Telehealth (INDEPENDENT_AMBULATORY_CARE_PROVIDER_SITE_OTHER): Payer: BC Managed Care – PPO | Admitting: Nurse Practitioner

## 2022-08-18 ENCOUNTER — Encounter: Payer: Self-pay | Admitting: Nurse Practitioner

## 2022-08-18 DIAGNOSIS — M109 Gout, unspecified: Secondary | ICD-10-CM

## 2022-08-18 MED ORDER — INDOMETHACIN 50 MG PO CAPS: 50.0000 mg | ORAL_CAPSULE | Freq: Three times a day (TID) | ORAL | 0 refills | Status: AC

## 2022-08-18 MED ORDER — ALLOPURINOL 100 MG PO TABS: 100.0000 mg | ORAL_TABLET | Freq: Every day | ORAL | 1 refills | Status: DC

## 2022-08-18 NOTE — Progress Notes (Signed)
There were no vitals taken for this visit.   Subjective:    Patient ID: Breanna Rosario, female    DOB: 1976/04/20, 47 y.o.   MRN: [id number]  HPI: Breanna Rosario is a 47 y.o. female  Chief Complaint  Patient presents with   Gout    Pt states she feels like the prednisone did not really helping.   GOUT Patient states the prednisone didn't really help her this time.  Patient states she is still having pain and swelling.  The toe is stiffening up again.  She is drinking a lot of water.  No sodas since December.  Not eating red meat.     Relevant past medical, surgical, family and social history reviewed and updated as indicated. Interim medical history since our last visit reviewed. Allergies and medications reviewed and updated.  Review of Systems  Musculoskeletal:        Toe pain and swelling    Per HPI unless specifically indicated above     Objective:    There were no vitals taken for this visit.  Wt Readings from Last 3 Encounters:  08/04/22 (!) 321 lb 6.4 oz (145.8 kg)  05/27/22 (!) 321 lb 11.2 oz (145.9 kg)  06/29/21 (!) 324 lb 12.8 oz (147.3 kg)    Physical Exam Vitals and nursing note reviewed.  HENT:     Head: Normocephalic.     Right Ear: Hearing normal.     Left Ear: Hearing normal.     Nose: Nose normal.  Eyes:     Pupils: Pupils are equal, round, and reactive to light.  Pulmonary:     Effort: Pulmonary effort is normal. No respiratory distress.  Neurological:     Mental Status: She is alert.  Psychiatric:        Mood and Affect: Mood normal.        Behavior: Behavior normal.        Thought Content: Thought content normal.        Judgment: Judgment normal.     Results for orders placed or performed in visit on 05/27/22  Microscopic Examination  Result Value Ref Range   WBC, UA >30 (A) 0 - 5 /hpf   RBC, Urine 0-2 0 - 2 /hpf   Epithelial Cells (non renal) 0-10 0 - 10 /hpf   Casts None seen None seen /lpf   Bacteria, UA Many (A) None  seen/Few  Uric acid  Result Value Ref Range   Uric Acid 10.8 (H) 2.6 - 6.2 mg/dL  CBC with Differential/Platelet  Result Value Ref Range   WBC 8.1 3.4 - 10.8 x10E3/uL   RBC 3.95 3.77 - 5.28 x10E6/uL   Hemoglobin 11.4 11.1 - 15.9 g/dL   Hematocrit 35.7 34.0 - 46.6 %   MCV 90 79 - 97 fL   MCH 28.9 26.6 - 33.0 pg   MCHC 31.9 31.5 - 35.7 g/dL   RDW 12.7 11.7 - 15.4 %   Platelets 228 150 - 450 x10E3/uL   Neutrophils 70 Not Estab. %   Lymphs 24 Not Estab. %   Monocytes 4 Not Estab. %   Eos 2 Not Estab. %   Basos 0 Not Estab. %   Neutrophils Absolute 5.6 1.4 - 7.0 x10E3/uL   Lymphocytes Absolute 1.9 0.7 - 3.1 x10E3/uL   Monocytes Absolute 0.4 0.1 - 0.9 x10E3/uL   EOS (ABSOLUTE) 0.1 0.0 - 0.4 x10E3/uL   Basophils Absolute 0.0 0.0 - 0.2 x10E3/uL   Immature Granulocytes  0 Not Estab. %   Immature Grans (Abs) 0.0 0.0 - 0.1 x10E3/uL  Comprehensive metabolic panel  Result Value Ref Range   Glucose 136 (H) 70 - 99 mg/dL   BUN 32 (H) 6 - 24 mg/dL   Creatinine, Ser 1.13 (H) 0.57 - 1.00 mg/dL   eGFR 61 >59 mL/min/1.73   BUN/Creatinine Ratio 28 (H) 9 - 23   Sodium 142 134 - 144 mmol/L   Potassium 4.8 3.5 - 5.2 mmol/L   Chloride 106 96 - 106 mmol/L   CO2 19 (L) 20 - 29 mmol/L   Calcium 8.9 8.7 - 10.2 mg/dL   Total Protein 7.0 6.0 - 8.5 g/dL   Albumin 4.0 3.9 - 4.9 g/dL   Globulin, Total 3.0 1.5 - 4.5 g/dL   Albumin/Globulin Ratio 1.3 1.2 - 2.2   Bilirubin Total 0.2 0.0 - 1.2 mg/dL   Alkaline Phosphatase 93 44 - 121 IU/L   AST 31 0 - 40 IU/L   ALT 27 0 - 32 IU/L  Lipid panel  Result Value Ref Range   Cholesterol, Total 121 100 - 199 mg/dL   Triglycerides 223 (H) 0 - 149 mg/dL   HDL 29 (L) >39 mg/dL   VLDL Cholesterol Cal 36 5 - 40 mg/dL   LDL Chol Calc (NIH) 56 0 - 99 mg/dL   Chol/HDL Ratio 4.2 0.0 - 4.4 ratio  TSH  Result Value Ref Range   TSH 2.320 0.450 - 4.500 uIU/mL  Urinalysis, Routine w reflex microscopic  Result Value Ref Range   Specific Gravity, UA 1.015 1.005 - 1.030    pH, UA 5.5 5.0 - 7.5   Color, UA Yellow Yellow   Appearance Ur Cloudy (A) Clear   Leukocytes,UA 2+ (A) Negative   Protein,UA 2+ (A) Negative/Trace   Glucose, UA Negative Negative   Ketones, UA Negative Negative   RBC, UA Negative Negative   Bilirubin, UA Negative Negative   Urobilinogen, Ur 0.2 0.2 - 1.0 mg/dL   Nitrite, UA Positive (A) Negative   Microscopic Examination See below:       Assessment & Plan:   Problem List Items Addressed This Visit   None Visit Diagnoses     Acute gout involving toe of left foot, unspecified cause    -  Primary   Will treat with indomethicin.  Recommend starting allopurinol a week after completing the indomethicin. Discussed side effects and benefits of medication.   Relevant Medications   indomethacin (INDOCIN) 50 MG capsule   allopurinol (ZYLOPRIM) 100 MG tablet        Follow up plan: Return if symptoms worsen or fail to improve.  This visit was completed via MyChart due to the restrictions of the COVID-19 pandemic. All issues as above were discussed and addressed. Physical exam was done as above through visual confirmation on MyChart. If it was felt that the patient should be evaluated in the office, they were directed there. The patient verbally consented to this visit. Location of the patient: Home Location of the provider: Office Those involved with this call:  Provider: Jon Billings, NP CMA: Yvonna Alanis, CMA Front Desk/Registration: Lynnell Catalan This encounter was conducted via video.  I spent 30 dedicated to the care of this patient on the date of this encounter to include previsit review of symptoms, plan of care and follow up, face to face time with the patient, and post visit ordering of testing.

## 2022-09-05 ENCOUNTER — Telehealth (INDEPENDENT_AMBULATORY_CARE_PROVIDER_SITE_OTHER): Payer: BC Managed Care – PPO | Admitting: Nurse Practitioner

## 2022-09-05 ENCOUNTER — Ambulatory Visit: Payer: Self-pay | Admitting: *Deleted

## 2022-09-05 ENCOUNTER — Encounter: Payer: Self-pay | Admitting: Nurse Practitioner

## 2022-09-05 DIAGNOSIS — M109 Gout, unspecified: Secondary | ICD-10-CM | POA: Diagnosis not present

## 2022-09-05 MED ORDER — INDOMETHACIN 50 MG PO CAPS: 50.0000 mg | ORAL_CAPSULE | Freq: Three times a day (TID) | ORAL | 0 refills | Status: AC

## 2022-09-05 NOTE — Telephone Encounter (Signed)
Attempted to return her call.   Voicemail is full so unable to leave a message.    Will attempt again later.

## 2022-09-05 NOTE — Progress Notes (Signed)
There were no vitals taken for this visit.   Subjective:    Patient ID: Breanna Rosario, female    DOB: 09-29-1975, 47 y.o.   MRN: [id number]  HPI: Breanna Rosario is a 47 y.o. female  Chief Complaint  Patient presents with   Gout   GOUT Patient states she is having right toe pain.  Pain started last night between 10-11.  She didn't get any sleep last night due to the pain.  Feels like her foot is spasming.  Patient states she started the allopurinol about a week ago.   Duration: started last night Right 1st metatarsophalangeal pain: yes Left 1st metatarsophalangeal pain: no Right knee pain: no Left knee pain: no Severity: severe  Quality:  spasming and tender, shooting pain through her foot Swelling: yes Redness:  darker color but not red Trauma: no Recent dietary change or indiscretion: no Fevers: no Nausea/vomiting: no Aggravating factors: Alleviating factors:  Status:  worse Treatments attempted:  Relevant past medical, surgical, family and social history reviewed and updated as indicated. Interim medical history since our last visit reviewed. Allergies and medications reviewed and updated.  Review of Systems  Musculoskeletal:        Right toe pain, swelling, darkened color and stiffness.    Per HPI unless specifically indicated above     Objective:    There were no vitals taken for this visit.  Wt Readings from Last 3 Encounters:  08/04/22 (!) 321 lb 6.4 oz (145.8 kg)  05/27/22 (!) 321 lb 11.2 oz (145.9 kg)  06/29/21 (!) 324 lb 12.8 oz (147.3 kg)    Physical Exam Vitals and nursing note reviewed.  Constitutional:      General: She is not in acute distress.    Appearance: She is not ill-appearing.  HENT:     Head: Normocephalic.     Right Ear: Hearing normal.     Left Ear: Hearing normal.     Nose: Nose normal.  Pulmonary:     Effort: Pulmonary effort is normal. No respiratory distress.  Neurological:     Mental Status: She is alert.   Psychiatric:        Mood and Affect: Mood normal. Affect is tearful.        Behavior: Behavior normal.        Thought Content: Thought content normal.        Judgment: Judgment normal.     Results for orders placed or performed in visit on 05/27/22  Microscopic Examination  Result Value Ref Range   WBC, UA >30 (A) 0 - 5 /hpf   RBC, Urine 0-2 0 - 2 /hpf   Epithelial Cells (non renal) 0-10 0 - 10 /hpf   Casts None seen None seen /lpf   Bacteria, UA Many (A) None seen/Few  Uric acid  Result Value Ref Range   Uric Acid 10.8 (H) 2.6 - 6.2 mg/dL  CBC with Differential/Platelet  Result Value Ref Range   WBC 8.1 3.4 - 10.8 x10E3/uL   RBC 3.95 3.77 - 5.28 x10E6/uL   Hemoglobin 11.4 11.1 - 15.9 g/dL   Hematocrit 35.7 34.0 - 46.6 %   MCV 90 79 - 97 fL   MCH 28.9 26.6 - 33.0 pg   MCHC 31.9 31.5 - 35.7 g/dL   RDW 12.7 11.7 - 15.4 %   Platelets 228 150 - 450 x10E3/uL   Neutrophils 70 Not Estab. %   Lymphs 24 Not Estab. %   Monocytes 4 Not  Estab. %   Eos 2 Not Estab. %   Basos 0 Not Estab. %   Neutrophils Absolute 5.6 1.4 - 7.0 x10E3/uL   Lymphocytes Absolute 1.9 0.7 - 3.1 x10E3/uL   Monocytes Absolute 0.4 0.1 - 0.9 x10E3/uL   EOS (ABSOLUTE) 0.1 0.0 - 0.4 x10E3/uL   Basophils Absolute 0.0 0.0 - 0.2 x10E3/uL   Immature Granulocytes 0 Not Estab. %   Immature Grans (Abs) 0.0 0.0 - 0.1 x10E3/uL  Comprehensive metabolic panel  Result Value Ref Range   Glucose 136 (H) 70 - 99 mg/dL   BUN 32 (H) 6 - 24 mg/dL   Creatinine, Ser 1.13 (H) 0.57 - 1.00 mg/dL   eGFR 61 >59 mL/min/1.73   BUN/Creatinine Ratio 28 (H) 9 - 23   Sodium 142 134 - 144 mmol/L   Potassium 4.8 3.5 - 5.2 mmol/L   Chloride 106 96 - 106 mmol/L   CO2 19 (L) 20 - 29 mmol/L   Calcium 8.9 8.7 - 10.2 mg/dL   Total Protein 7.0 6.0 - 8.5 g/dL   Albumin 4.0 3.9 - 4.9 g/dL   Globulin, Total 3.0 1.5 - 4.5 g/dL   Albumin/Globulin Ratio 1.3 1.2 - 2.2   Bilirubin Total 0.2 0.0 - 1.2 mg/dL   Alkaline Phosphatase 93 44 - 121 IU/L    AST 31 0 - 40 IU/L   ALT 27 0 - 32 IU/L  Lipid panel  Result Value Ref Range   Cholesterol, Total 121 100 - 199 mg/dL   Triglycerides 223 (H) 0 - 149 mg/dL   HDL 29 (L) >39 mg/dL   VLDL Cholesterol Cal 36 5 - 40 mg/dL   LDL Chol Calc (NIH) 56 0 - 99 mg/dL   Chol/HDL Ratio 4.2 0.0 - 4.4 ratio  TSH  Result Value Ref Range   TSH 2.320 0.450 - 4.500 uIU/mL  Urinalysis, Routine w reflex microscopic  Result Value Ref Range   Specific Gravity, UA 1.015 1.005 - 1.030   pH, UA 5.5 5.0 - 7.5   Color, UA Yellow Yellow   Appearance Ur Cloudy (A) Clear   Leukocytes,UA 2+ (A) Negative   Protein,UA 2+ (A) Negative/Trace   Glucose, UA Negative Negative   Ketones, UA Negative Negative   RBC, UA Negative Negative   Bilirubin, UA Negative Negative   Urobilinogen, Ur 0.2 0.2 - 1.0 mg/dL   Nitrite, UA Positive (A) Negative   Microscopic Examination See below:       Assessment & Plan:   Problem List Items Addressed This Visit   None Visit Diagnoses     Acute gout of right foot, unspecified cause    -  Primary   Onset last night. Started allopurinol last week.  Will start Indomethicin.  May need to increase the Allopurionol dose in the future.   Relevant Medications   indomethacin (INDOCIN) 50 MG capsule        Follow up plan: No follow-ups on file.  This visit was completed via MyChart due to the restrictions of the COVID-19 pandemic. All issues as above were discussed and addressed. Physical exam was done as above through visual confirmation on MyChart. If it was felt that the patient should be evaluated in the office, they were directed there. The patient verbally consented to this visit. Location of the patient: Home Location of the provider: Office Those involved with this call:  Provider: Jon Billings, NP CMA: NA Front Desk/Registration: Lynnell Catalan This encounter was conducted via video.  I spent 20  dedicated to the care of this patient on the date of this encounter to  include previsit review of symptoms, plan of care and follow up, face to face time with the patient, and post visit ordering of testing.

## 2022-09-05 NOTE — Telephone Encounter (Signed)
I can do a video visit at 1pm today.

## 2022-09-05 NOTE — Telephone Encounter (Signed)
Message from Brightwood Callas sent at 09/05/2022  8:09 AM EDT  Summary: gout on foot   Pt called with gout in her rt foot.  She said she was prescribed something but she had a flare up last night.  She can hardly walk on it.  CB#  [id number]          Call History   Type Contact Phone/Fax User  09/05/2022 08:07 AM EDT Phone (Incoming) Annemarie, Ozaeta R (Self) [phone] Lemmie Evens) Greggory Keen D

## 2022-09-05 NOTE — Telephone Encounter (Signed)
  Chief Complaint: "Gout pain" Symptoms: Right foot pain from toes to ankle. Mild swelling, burning type pain.Greater at big toe, radiates through foot. "Can barely walk." Frequency: Last night Pertinent Negatives: Patient denies injury Disposition: [] ED /[] Urgent Care (no appt availability in office) / [] Appointment(In office/virtual)/ []  [facility]/ [] Home Care/ [] Refused Recommended Disposition /[] [facility]/ [x]  Follow-up with PCP Additional Notes: Pt tearful, states seen x 2 for flare ups, States medication helped initially, "Until last night."  Pt states could not make it in for appt. Attempted to secure VV for today, no availability. Please advise.  Reason for Disposition  [1] SEVERE pain (e.g., excruciating, unable to do any normal activities) AND [2] not improved after 2 hours of pain medicine  Answer Assessment - Initial Assessment Questions 1. ONSET: "When did the pain start?"      Last night 2. LOCATION: "Where is the pain located?"      Right foot, toes to ankle 3. PAIN: "How bad is the pain?"    (Scale 1-10; or mild, moderate, severe)  - MILD (1-3): doesn't interfere with normal activities.   - MODERATE (4-7): interferes with normal activities (e.g., work or school) or awakens from sleep, limping.   - SEVERE (8-10): excruciating pain, unable to do any normal activities, unable to walk.      10/10 4. WORK OR EXERCISE: "Has there been any recent work or exercise that involved this part of the body?"      no 5. CAUSE: "What do you think is causing the foot pain?"     Gout 6. OTHER SYMPTOMS: "Do you have any other symptoms?" (e.g., leg pain, rash, fever, numbness) Mild swelling, burning type pain. "Some spasms."  Protocols used: Foot Pain-A-AH

## 2022-09-05 NOTE — Telephone Encounter (Signed)
Called and scheduled the patient an appointment at 1:00.

## 2022-09-06 ENCOUNTER — Ambulatory Visit
Admission: EM | Admit: 2022-09-06 | Discharge: 2022-09-06 | Disposition: A | Discharge status: Home / Self Care | Payer: BC Managed Care – PPO | Attending: Emergency Medicine | Admitting: Emergency Medicine

## 2022-09-06 DIAGNOSIS — M109 Gout, unspecified: Secondary | ICD-10-CM | POA: Diagnosis not present

## 2022-09-06 MED ORDER — KETOROLAC TROMETHAMINE 30 MG/ML IJ SOLN
30.0000 mg | Freq: Once | INTRAMUSCULAR | Status: AC
  Administered 2022-09-06: 30 mg via INTRAMUSCULAR

## 2022-09-06 NOTE — ED Provider Notes (Signed)
[facility]    CSN: [id number] Arrival date & time: 09/06/22  0856      History   Chief Complaint Chief Complaint  Patient presents with   Foot Pain    HPI Breanna Rosario is a 47 y.o. female.   HPI  47 year old female with a past medical history that is significant for hypertension, MI, benign hypertensive renal disease, and obesity presenting for evaluation of right foot pain.  She recently developed gout and was started on allopurinol approximately 2 weeks ago.  Since then she has been having an increase in gout flares.  She had a telehealth visit with her PCP yesterday and was advised to continue taking the allopurinol and was also prescribed indomethacin but she has not been able to get to the pharmacy due to an inability to walk secondary to the pain in her foot.  She states that her gout typically occurs in her feet and it is in the same position as usual.  Past Medical History:  Diagnosis Date   Allergic rhinitis    Heart attack ([place]) 03/2015   Hypertension     Patient Active Problem List   Diagnosis Date Noted   COVID-19 02/09/2021   Allergic rhinitis 09/03/2019   Benign hypertensive renal disease 10/06/2017   Mixed hyperlipidemia 10/06/2017   Asthma due to environmental allergies 10/06/2017   Class 3 severe obesity due to excess calories with body mass index (BMI) of 50.0 to 59.9 in adult [facility]) 04/27/2017   Eczema 08/30/2016   Major depression, recurrent ([place]) 08/30/2016    Past Surgical History:  Procedure Laterality Date   CARDIAC CATHETERIZATION Right 03/17/2015   Procedure: Left Heart Cath and Coronary Angiography;  Surgeon: Dionisio David, MD;  Location: [facility];  Service: Cardiovascular;  Laterality: Right;   CARDIAC CATHETERIZATION N/A 03/18/2015   Procedure: Coronary Stent Intervention;  Surgeon: Yolonda Kida, MD;  Location: [facility];  Service: Cardiovascular;  Laterality: N/A;   None      OB History      Gravida  1   Para  1   Term  1   Preterm      AB      Living  1      SAB      IAB      Ectopic      Multiple      Live Births  1            Home Medications    Prior to Admission medications   Medication Sig Start Date End Date Taking? Authorizing Provider  albuterol (VENTOLIN HFA) 108 (90 Base) MCG/ACT inhaler Inhale 2 puffs into the lungs every 6 (six) hours as needed for wheezing or shortness of breath. 04/22/20   Johnson, Megan P, DO  allopurinol (ZYLOPRIM) 100 MG tablet Take 1 tablet (100 mg total) by mouth daily. 08/18/22   Jon Billings, NP  amLODipine-benazepril (LOTREL) 5-10 MG capsule Take 1 capsule by mouth daily. 05/27/22   Jon Billings, NP  aspirin EC 81 MG tablet Take 81 mg by mouth daily.    [provider]  clopidogrel (PLAVIX) 75 MG tablet Take 1 tablet (75 mg total) by mouth daily with breakfast. 05/27/22   Jon Billings, NP  fluticasone furoate-vilanterol (BREO ELLIPTA) 100-25 MCG/INH AEPB INHALE 1 PUFF INTO THE LUNGS DAILY 04/22/20   Park Liter P, DO  indomethacin (INDOCIN) 50 MG capsule Take 1 capsule (50 mg total) by mouth 3 (  three) times daily with meals for 5 days. 09/05/22 09/10/22  Jon Billings, NP  metoprolol succinate (TOPROL-XL) 50 MG 24 hr tablet Take 1 tablet (50 mg total) by mouth daily. Take with or immediately following a meal. 05/27/22   Jon Billings, NP  montelukast (SINGULAIR) 10 MG tablet TAKE 1 TABLET(10 MG) BY MOUTH AT BEDTIME 05/27/22   Jon Billings, NP  NEXLIZET 180-10 MG TABS TAKE 1 TABLET BY MOUTH ONCE A DAY 08/18/22   Dionisio David, MD  nitroGLYCERIN (NITROSTAT) 0.4 MG SL tablet Place under the tongue. 10/26/20   [provider]  rosuvastatin (CRESTOR) 40 MG tablet Take 1 tablet (40 mg total) by mouth daily. 05/27/22   Jon Billings, NP  sertraline (ZOLOFT) 100 MG tablet Take 1.5 tabs daily 05/27/22   Jon Billings, NP    Family History Family History  Problem Relation Age of  Onset   Coronary artery disease Father        Died at age 70; four-vessel bypass at 47 years of age   Hypertension Father    Arthritis Mother    Cancer Mother    Migraines Mother     Social History Social History   Tobacco Use   Smoking status: Former   Smokeless tobacco: Never  Scientific laboratory technician Use: Never used  Substance Use Topics   Alcohol use: No   Drug use: No     Allergies   Patient has no known allergies.   Review of Systems Review of Systems  Musculoskeletal:  Positive for myalgias.  Skin:  Positive for color change.     Physical Exam Triage Vital Signs ED Triage Vitals  Enc Vitals Group     BP      Pulse      Resp      Temp      Temp src      SpO2      Weight      Height      Head Circumference      Peak Flow      Pain Score      Pain Loc      Pain Edu?      Excl. in [place]?    No data found.  Updated Vital Signs BP (!) 140/93 (BP Location: Right Arm)   Pulse 96   Temp 98.4 F (36.9 C) (Oral)   Resp 16   SpO2 98%   Visual Acuity Right Eye Distance:   Left Eye Distance:   Bilateral Distance:    Right Eye Near:   Left Eye Near:    Bilateral Near:     Physical Exam Vitals and nursing note reviewed.  Constitutional:      Appearance: Normal appearance. She is not ill-appearing.  Musculoskeletal:        General: Swelling and tenderness present. No deformity or signs of injury. Normal range of motion.  Skin:    General: Skin is warm and dry.     Capillary Refill: Capillary refill takes less than 2 seconds.     Findings: Erythema present.  Neurological:     Mental Status: She is alert.      UC Treatments / Results  Labs (all labs ordered are listed, but only abnormal results are displayed) Labs Reviewed - No data to display  EKG   Radiology No results found.  Procedures Procedures (including critical care time)  Medications Ordered in UC Medications  ketorolac (TORADOL) 30 MG/ML injection 30 mg (has  no  administration in time range)    Initial Impression / Assessment and Plan / UC Course  I have reviewed the triage vital signs and the nursing notes.  Pertinent labs & imaging results that were available during my care of the patient were reviewed by me and considered in my medical decision making (see chart for details).   The patient is a pleasant, nontoxic-appearing 47 year old female here for evaluation of a gout flare in her right foot that has been present for the last 2 days . Patient seen image above there is erythema at the MTP joint of the second and third toe as well as edema across the midfoot.  The patient reports this is pretty typical of her gout presentation.  Her DP and PT pulses are 2+ and her cap refill is less than 3 seconds.  She reports that she has been trying to identify her dietary triggers and she is cut out soda and red meat which has been increasing her dietary intake of spinach.  I did advise the patient that spinach can be contributing to her gout flares along with her recent starting of allopurinol which can increase the number of flares initially.  I have encouraged her to continue taking the allopurinol as this will help decrease her flares over time.  She has a prescription for indomethacin that was prescribed by her PCP and sent to pharmacy so I have advised her to pick that up and start taking it to help with pain.  I will order a 30 mg injection of Toradol in clinic to help with pain relief.  The patient reports that she has tried prednisone in the past without any improvement of her symptoms.  Return precautions reviewed.  Final Clinical Impressions(s) / UC Diagnoses   Final diagnoses:  Acute gout of right foot, unspecified cause     Discharge Instructions      Use your Indomethacin as directed to help with pain.  Continue your Allopurinol to help decrease the Uric acid in your system and decrease yoru gout flares.  Continue to increase your oral fluid  intake hep flush the uric acid from your system.  Follow the dietary guide in your discharge packet to help identify food triggers that could be contributing to your flares.      ED Prescriptions   None    PDMP not reviewed this encounter.   Margarette Canada, NP 09/06/22 1108

## 2022-09-06 NOTE — ED Triage Notes (Addendum)
Patient presents to [facility] for gout flare-up to left foot since Sunday. Taking allopurinol last dose 2 days ago. States she had a video visit yesterday with PCP. She was instructed to continue taking allopurinol. Here for 2nd opinion.

## 2022-09-06 NOTE — Discharge Instructions (Signed)
Use your Indomethacin as directed to help with pain.  Continue your Allopurinol to help decrease the Uric acid in your system and decrease yoru gout flares.  Continue to increase your oral fluid intake hep flush the uric acid from your system.  Follow the dietary guide in your discharge packet to help identify food triggers that could be contributing to your flares.

## 2022-09-14 NOTE — Progress Notes (Unsigned)
There were no vitals taken for this visit.   Subjective:    Patient ID: Breanna Rosario, female    DOB: 1975-10-31, 47 y.o.   MRN: [id number]  HPI: Breanna Rosario is a 47 y.o. female  No chief complaint on file.   Relevant past medical, surgical, family and social history reviewed and updated as indicated. Interim medical history since our last visit reviewed. Allergies and medications reviewed and updated.  Review of Systems  Per HPI unless specifically indicated above     Objective:    There were no vitals taken for this visit.  Wt Readings from Last 3 Encounters:  08/04/22 (!) 321 lb 6.4 oz (145.8 kg)  05/27/22 (!) 321 lb 11.2 oz (145.9 kg)  06/29/21 (!) 324 lb 12.8 oz (147.3 kg)    Physical Exam  Results for orders placed or performed in visit on 05/27/22  Microscopic Examination  Result Value Ref Range   WBC, UA >30 (A) 0 - 5 /hpf   RBC, Urine 0-2 0 - 2 /hpf   Epithelial Cells (non renal) 0-10 0 - 10 /hpf   Casts None seen None seen /lpf   Bacteria, UA Many (A) None seen/Few  Uric acid  Result Value Ref Range   Uric Acid 10.8 (H) 2.6 - 6.2 mg/dL  CBC with Differential/Platelet  Result Value Ref Range   WBC 8.1 3.4 - 10.8 x10E3/uL   RBC 3.95 3.77 - 5.28 x10E6/uL   Hemoglobin 11.4 11.1 - 15.9 g/dL   Hematocrit 35.7 34.0 - 46.6 %   MCV 90 79 - 97 fL   MCH 28.9 26.6 - 33.0 pg   MCHC 31.9 31.5 - 35.7 g/dL   RDW 12.7 11.7 - 15.4 %   Platelets 228 150 - 450 x10E3/uL   Neutrophils 70 Not Estab. %   Lymphs 24 Not Estab. %   Monocytes 4 Not Estab. %   Eos 2 Not Estab. %   Basos 0 Not Estab. %   Neutrophils Absolute 5.6 1.4 - 7.0 x10E3/uL   Lymphocytes Absolute 1.9 0.7 - 3.1 x10E3/uL   Monocytes Absolute 0.4 0.1 - 0.9 x10E3/uL   EOS (ABSOLUTE) 0.1 0.0 - 0.4 x10E3/uL   Basophils Absolute 0.0 0.0 - 0.2 x10E3/uL   Immature Granulocytes 0 Not Estab. %   Immature Grans (Abs) 0.0 0.0 - 0.1 x10E3/uL  Comprehensive metabolic panel  Result Value Ref Range    Glucose 136 (H) 70 - 99 mg/dL   BUN 32 (H) 6 - 24 mg/dL   Creatinine, Ser 1.13 (H) 0.57 - 1.00 mg/dL   eGFR 61 >59 mL/min/1.73   BUN/Creatinine Ratio 28 (H) 9 - 23   Sodium 142 134 - 144 mmol/L   Potassium 4.8 3.5 - 5.2 mmol/L   Chloride 106 96 - 106 mmol/L   CO2 19 (L) 20 - 29 mmol/L   Calcium 8.9 8.7 - 10.2 mg/dL   Total Protein 7.0 6.0 - 8.5 g/dL   Albumin 4.0 3.9 - 4.9 g/dL   Globulin, Total 3.0 1.5 - 4.5 g/dL   Albumin/Globulin Ratio 1.3 1.2 - 2.2   Bilirubin Total 0.2 0.0 - 1.2 mg/dL   Alkaline Phosphatase 93 44 - 121 IU/L   AST 31 0 - 40 IU/L   ALT 27 0 - 32 IU/L  Lipid panel  Result Value Ref Range   Cholesterol, Total 121 100 - 199 mg/dL   Triglycerides 223 (H) 0 - 149 mg/dL   HDL 29 (L) >39 mg/dL  VLDL Cholesterol Cal 36 5 - 40 mg/dL   LDL Chol Calc (NIH) 56 0 - 99 mg/dL   Chol/HDL Ratio 4.2 0.0 - 4.4 ratio  TSH  Result Value Ref Range   TSH 2.320 0.450 - 4.500 uIU/mL  Urinalysis, Routine w reflex microscopic  Result Value Ref Range   Specific Gravity, UA 1.015 1.005 - 1.030   pH, UA 5.5 5.0 - 7.5   Color, UA Yellow Yellow   Appearance Ur Cloudy (A) Clear   Leukocytes,UA 2+ (A) Negative   Protein,UA 2+ (A) Negative/Trace   Glucose, UA Negative Negative   Ketones, UA Negative Negative   RBC, UA Negative Negative   Bilirubin, UA Negative Negative   Urobilinogen, Ur 0.2 0.2 - 1.0 mg/dL   Nitrite, UA Positive (A) Negative   Microscopic Examination See below:       Assessment & Plan:   Problem List Items Addressed This Visit   None    Follow up plan: No follow-ups on file.

## 2022-09-15 ENCOUNTER — Ambulatory Visit: Payer: BC Managed Care – PPO | Admitting: Nurse Practitioner

## 2022-09-15 ENCOUNTER — Encounter: Payer: Self-pay | Admitting: Nurse Practitioner

## 2022-09-15 VITALS — BP 146/85 | HR 78 | Temp 98.6°F | Wt 322.4 lb

## 2022-09-15 DIAGNOSIS — Z0289 Encounter for other administrative examinations: Secondary | ICD-10-CM

## 2022-09-15 DIAGNOSIS — M1A9XX0 Chronic gout, unspecified, without tophus (tophi): Secondary | ICD-10-CM

## 2022-09-15 DIAGNOSIS — I129 Hypertensive chronic kidney disease with stage 1 through stage 4 chronic kidney disease, or unspecified chronic kidney disease: Secondary | ICD-10-CM | POA: Diagnosis not present

## 2022-09-15 NOTE — Assessment & Plan Note (Signed)
Chronic. Not well controlled. Will continue with current dose of medication.  Patient will work on diet and exercise over the next 3 months.  Follow up in 3 months.  Will increase medication at that time if needed.

## 2022-10-16 ENCOUNTER — Other Ambulatory Visit: Payer: Self-pay | Admitting: Nurse Practitioner

## 2022-10-16 DIAGNOSIS — F331 Major depressive disorder, recurrent, moderate: Secondary | ICD-10-CM

## 2022-10-18 NOTE — Telephone Encounter (Signed)
Unable to refill per protocol, Rx request is too soon. Last refill 05/27/22 for 90 and 1 refill.  Requested Prescriptions  Pending Prescriptions Disp Refills   sertraline (ZOLOFT) 100 MG tablet [Pharmacy Med Name: SERTRALINE 100MG  TABLETS] 135 tablet 1    Sig: TAKE 1 AND 1/2 TABLETS(150 MG) BY MOUTH DAILY     Psychiatry:  Antidepressants - SSRI - sertraline Passed - 10/16/2022  1:22 PM      Passed - AST in normal range and within 360 days    AST  Date Value Ref Range Status  05/27/2022 31 0 - 40 IU/L Final         Passed - ALT in normal range and within 360 days    ALT  Date Value Ref Range Status  05/27/2022 27 0 - 32 IU/L Final         Passed - Completed PHQ-2 or PHQ-9 in the last 360 days      Passed - Valid encounter within last 6 months    Recent Outpatient Visits           1 month ago Chronic gout without tophus, unspecified cause, unspecified site   [facility] Larae Grooms, NP   1 month ago Acute gout of right foot, unspecified cause   [place] [facility] Larae Grooms, NP   2 months ago Acute gout involving toe of left foot, unspecified cause   [place] [facility] Larae Grooms, NP   2 months ago Acute gout involving toe of left foot, unspecified cause   [place] [facility] Larae Grooms, NP   4 months ago Annual physical exam   [place] [facility] Larae Grooms, NP       Future Appointments             In 1 month Larae Grooms, NP  [facility]

## 2022-11-24 ENCOUNTER — Encounter: Payer: Self-pay | Admitting: Nurse Practitioner

## 2022-11-24 ENCOUNTER — Ambulatory Visit: Payer: BC Managed Care – PPO | Admitting: Nurse Practitioner

## 2022-11-24 VITALS — BP 127/84 | HR 72 | Temp 98.4°F | Wt 331.0 lb

## 2022-11-24 DIAGNOSIS — E782 Mixed hyperlipidemia: Secondary | ICD-10-CM | POA: Diagnosis not present

## 2022-11-24 DIAGNOSIS — I129 Hypertensive chronic kidney disease with stage 1 through stage 4 chronic kidney disease, or unspecified chronic kidney disease: Secondary | ICD-10-CM

## 2022-11-24 DIAGNOSIS — F331 Major depressive disorder, recurrent, moderate: Secondary | ICD-10-CM

## 2022-11-24 DIAGNOSIS — Z6841 Body Mass Index (BMI) 40.0 and over, adult: Secondary | ICD-10-CM

## 2022-11-24 DIAGNOSIS — J45909 Unspecified asthma, uncomplicated: Secondary | ICD-10-CM

## 2022-11-24 MED ORDER — METOPROLOL SUCCINATE ER 50 MG PO TB24: 50.0000 mg | ORAL_TABLET | Freq: Every day | ORAL | 1 refills | Status: DC

## 2022-11-24 MED ORDER — AMLODIPINE BESY-BENAZEPRIL HCL 5-10 MG PO CAPS: 1.0000 | ORAL_CAPSULE | Freq: Every day | ORAL | 1 refills | Status: DC

## 2022-11-24 MED ORDER — SERTRALINE HCL 100 MG PO TABS: 200.0000 mg | ORAL_TABLET | Freq: Every day | ORAL | 1 refills | Status: DC

## 2022-11-24 MED ORDER — ROSUVASTATIN CALCIUM 40 MG PO TABS: 40.0000 mg | ORAL_TABLET | Freq: Every day | ORAL | 1 refills | Status: DC

## 2022-11-24 MED ORDER — MONTELUKAST SODIUM 10 MG PO TABS: ORAL_TABLET | ORAL | 1 refills | Status: DC

## 2022-11-24 MED ORDER — SERTRALINE HCL 100 MG PO TABS: ORAL_TABLET | ORAL | 1 refills | Status: DC

## 2022-11-24 MED ORDER — CLOPIDOGREL BISULFATE 75 MG PO TABS: 75.0000 mg | ORAL_TABLET | Freq: Every day | ORAL | 1 refills | Status: DC

## 2022-11-24 NOTE — Assessment & Plan Note (Signed)
Chronic.  Controlled.  Continue with current medication regimen of Benazepril and Amlodipine. Refills sent today.   Labs ordered today.  Return to clinic in 6 months for reevaluation.  Call sooner if concerns arise.   

## 2022-11-24 NOTE — Assessment & Plan Note (Signed)
Chronic.  Controlled.  Continue with current medication regimen of Crestor 20mg.  Labs ordered today.  Return to clinic in 6 months for reevaluation.  Call sooner if concerns arise.   

## 2022-11-24 NOTE — Progress Notes (Signed)
BP 127/84   Pulse 72   Temp 98.4 F (36.9 C) (Oral)   Wt (!) 331 lb (150.1 kg)   SpO2 98%   BMI 55.77 kg/m    Subjective:    Patient ID: Breanna Rosario, female    DOB: 1976/06/08, 47 y.o.   MRN: [id number]  HPI: Breanna Rosario is a 47 y.o. female  Chief Complaint  Patient presents with   Hypertension    HYPERTENSION without Chronic Kidney Disease Hypertension status: controlled  Satisfied with current treatment? yes Duration of hypertension: years BP monitoring frequency:  not checking BP range:  BP medication side effects:  no Medication compliance: excellent compliance Previous BP meds:amlodipine and valsartan Aspirin: no Recurrent headaches: no Visual changes: no Palpitations: no Dyspnea: no Chest pain: no Lower extremity edema: no Dizzy/lightheaded: no  MOOD Patient states her mood is about the same.  The Zoloft does help with her symptoms.  She is okay with increasing the dose of her medication.  Feels like it may benefit her. Denies SI.   Flowsheet Row Office Visit from 11/24/2022 in [facility]  PHQ-9 Total Score 11         11/24/2022   10:28 AM 08/04/2022    3:24 PM 05/27/2022   10:55 AM 01/08/2019   10:38 AM  GAD 7 : Generalized Anxiety Score  Nervous, Anxious, on Edge 2 2 2 2   Control/stop worrying 2 1 1 2   Worry too much - different things 2 1 2 2   Trouble relaxing 2 1 1 2   Restless 2 1 2 3   Easily annoyed or irritable 2 2 2 2   Afraid - awful might happen 1 2 1 2   Total GAD 7 Score 13 10 11 15   Anxiety Difficulty Somewhat difficult Somewhat difficult  Somewhat difficult        Relevant past medical, surgical, family and social history reviewed and updated as indicated. Interim medical history since our last visit reviewed. Allergies and medications reviewed and updated.  Review of Systems  Eyes:  Negative for visual disturbance.  Respiratory:  Negative for cough, chest tightness and shortness of breath.    Cardiovascular:  Negative for chest pain, palpitations and leg swelling.  Musculoskeletal:        Not currently having gout symptoms.  Neurological:  Negative for dizziness and headaches.  Psychiatric/Behavioral:  Positive for dysphoric mood. Negative for suicidal ideas.     Per HPI unless specifically indicated above     Objective:    BP 127/84   Pulse 72   Temp 98.4 F (36.9 C) (Oral)   Wt (!) 331 lb (150.1 kg)   SpO2 98%   BMI 55.77 kg/m   Wt Readings from Last 3 Encounters:  11/24/22 (!) 331 lb (150.1 kg)  09/15/22 (!) 322 lb 6.4 oz (146.2 kg)  08/04/22 (!) 321 lb 6.4 oz (145.8 kg)    Physical Exam Vitals and nursing note reviewed.  Constitutional:      General: She is not in acute distress.    Appearance: Normal appearance. She is obese. She is not ill-appearing, toxic-appearing or diaphoretic.  HENT:     Head: Normocephalic.     Right Ear: External ear normal.     Left Ear: External ear normal.     Nose: Nose normal.     Mouth/Throat:     Mouth: Mucous membranes are moist.     Pharynx: Oropharynx is clear.  Eyes:     General:  Right eye: No discharge.        Left eye: No discharge.     Extraocular Movements: Extraocular movements intact.     Conjunctiva/sclera: Conjunctivae normal.     Pupils: Pupils are equal, round, and reactive to light.  Cardiovascular:     Rate and Rhythm: Normal rate and regular rhythm.     Heart sounds: No murmur heard. Pulmonary:     Effort: Pulmonary effort is normal. No respiratory distress.     Breath sounds: Normal breath sounds. No wheezing or rales.  Musculoskeletal:     Cervical back: Normal range of motion and neck supple.  Skin:    General: Skin is warm and dry.     Capillary Refill: Capillary refill takes less than 2 seconds.  Neurological:     General: No focal deficit present.     Mental Status: She is alert and oriented to person, place, and time. Mental status is at baseline.  Psychiatric:        Mood and  Affect: Mood normal.        Behavior: Behavior normal.        Thought Content: Thought content normal.        Judgment: Judgment normal.     Results for orders placed or performed in visit on 05/27/22  Microscopic Examination  Result Value Ref Range   WBC, UA >30 (A) 0 - 5 /hpf   RBC, Urine 0-2 0 - 2 /hpf   Epithelial Cells (non renal) 0-10 0 - 10 /hpf   Casts None seen None seen /lpf   Bacteria, UA Many (A) None seen/Few  Uric acid  Result Value Ref Range   Uric Acid 10.8 (H) 2.6 - 6.2 mg/dL  CBC with Differential/Platelet  Result Value Ref Range   WBC 8.1 3.4 - 10.8 x10E3/uL   RBC 3.95 3.77 - 5.28 x10E6/uL   Hemoglobin 11.4 11.1 - 15.9 g/dL   Hematocrit 21.3 08.6 - 46.6 %   MCV 90 79 - 97 fL   MCH 28.9 26.6 - 33.0 pg   MCHC 31.9 31.5 - 35.7 g/dL   RDW 57.8 46.9 - 62.9 %   Platelets 228 150 - 450 x10E3/uL   Neutrophils 70 Not Estab. %   Lymphs 24 Not Estab. %   Monocytes 4 Not Estab. %   Eos 2 Not Estab. %   Basos 0 Not Estab. %   Neutrophils Absolute 5.6 1.4 - 7.0 x10E3/uL   Lymphocytes Absolute 1.9 0.7 - 3.1 x10E3/uL   Monocytes Absolute 0.4 0.1 - 0.9 x10E3/uL   EOS (ABSOLUTE) 0.1 0.0 - 0.4 x10E3/uL   Basophils Absolute 0.0 0.0 - 0.2 x10E3/uL   Immature Granulocytes 0 Not Estab. %   Immature Grans (Abs) 0.0 0.0 - 0.1 x10E3/uL  Comprehensive metabolic panel  Result Value Ref Range   Glucose 136 (H) 70 - 99 mg/dL   BUN 32 (H) 6 - 24 mg/dL   Creatinine, Ser 5.28 (H) 0.57 - 1.00 mg/dL   eGFR 61 >41 LK/GMW/1.02   BUN/Creatinine Ratio 28 (H) 9 - 23   Sodium 142 134 - 144 mmol/L   Potassium 4.8 3.5 - 5.2 mmol/L   Chloride 106 96 - 106 mmol/L   CO2 19 (L) 20 - 29 mmol/L   Calcium 8.9 8.7 - 10.2 mg/dL   Total Protein 7.0 6.0 - 8.5 g/dL   Albumin 4.0 3.9 - 4.9 g/dL   Globulin, Total 3.0 1.5 - 4.5 g/dL   Albumin/Globulin Ratio 1.3  1.2 - 2.2   Bilirubin Total 0.2 0.0 - 1.2 mg/dL   Alkaline Phosphatase 93 44 - 121 IU/L   AST 31 0 - 40 IU/L   ALT 27 0 - 32 IU/L   Lipid panel  Result Value Ref Range   Cholesterol, Total 121 100 - 199 mg/dL   Triglycerides 161 (H) 0 - 149 mg/dL   HDL 29 (L) >09 mg/dL   VLDL Cholesterol Cal 36 5 - 40 mg/dL   LDL Chol Calc (NIH) 56 0 - 99 mg/dL   Chol/HDL Ratio 4.2 0.0 - 4.4 ratio  TSH  Result Value Ref Range   TSH 2.320 0.450 - 4.500 uIU/mL  Urinalysis, Routine w reflex microscopic  Result Value Ref Range   Specific Gravity, UA 1.015 1.005 - 1.030   pH, UA 5.5 5.0 - 7.5   Color, UA Yellow Yellow   Appearance Ur Cloudy (A) Clear   Leukocytes,UA 2+ (A) Negative   Protein,UA 2+ (A) Negative/Trace   Glucose, UA Negative Negative   Ketones, UA Negative Negative   RBC, UA Negative Negative   Bilirubin, UA Negative Negative   Urobilinogen, Ur 0.2 0.2 - 1.0 mg/dL   Nitrite, UA Positive (A) Negative   Microscopic Examination See below:       Assessment & Plan:   Problem List Items Addressed This Visit       Respiratory   Asthma due to environmental allergies    Chronic.  Controlled.  Continue with current medication regimen of Singulair.  Labs ordered today.  Return to clinic in 6 months for reevaluation.  Call sooner if concerns arise.        Relevant Medications   montelukast (SINGULAIR) 10 MG tablet     Genitourinary   Benign hypertensive renal disease    Chronic.  Controlled.  Continue with current medication regimen of Benazepril and Amlodipine.  Refills sent today.  Labs ordered today.  Return to clinic in 6 months for reevaluation.  Call sooner if concerns arise.        Relevant Orders   Comp Met (CMET)     Other   Major depression, recurrent (HCC) - Primary    Chronic.  Ongoing.  Will increase Zoloft to 200mg  daily.  Follow up in 6 months.  Call sooner if concerns arise.       Relevant Medications   sertraline (ZOLOFT) 100 MG tablet   Class 3 severe obesity due to excess calories with body mass index (BMI) of 50.0 to 59.9 in adult [facility])    Recommended eating smaller high protein, low  fat meals more frequently and exercising 30 mins a day 5 times a week with a goal of 10-15lb weight loss in the next 3 months.        Mixed hyperlipidemia    Chronic.  Controlled.  Continue with current medication regimen of Crestor 20mg .  Labs ordered today.  Return to clinic in 6 months for reevaluation.  Call sooner if concerns arise.         Relevant Medications   amLODipine-benazepril (LOTREL) 5-10 MG capsule   metoprolol succinate (TOPROL-XL) 50 MG 24 hr tablet   rosuvastatin (CRESTOR) 40 MG tablet   Other Relevant Orders   Lipid Profile     Follow up plan: Return in about 6 months (around 05/26/2023) for Physical and Fasting labs.

## 2022-11-24 NOTE — Assessment & Plan Note (Signed)
Chronic.  Controlled.  Continue with current medication regimen of Singulair.  Labs ordered today.  Return to clinic in 6 months for reevaluation.  Call sooner if concerns arise.

## 2022-11-24 NOTE — Assessment & Plan Note (Signed)
Chronic.  Ongoing.  Will increase Zoloft to 200mg  daily.  Follow up in 6 months.  Call sooner if concerns arise.

## 2022-11-24 NOTE — Assessment & Plan Note (Signed)
Recommended eating smaller high protein, low fat meals more frequently and exercising 30 mins a day 5 times a week with a goal of 10-15lb weight loss in the next 3 months.  

## 2022-11-25 LAB — COMPREHENSIVE METABOLIC PANEL
ALT: 27 IU/L (ref 0–32)
AST: 34 IU/L (ref 0–40)
Albumin/Globulin Ratio: 1.4 (ref 1.2–2.2)
Albumin: 4 g/dL (ref 3.9–4.9)
Alkaline Phosphatase: 96 IU/L (ref 44–121)
BUN/Creatinine Ratio: 24 — ABNORMAL HIGH (ref 9–23)
BUN: 26 mg/dL — ABNORMAL HIGH (ref 6–24)
Bilirubin Total: 0.3 mg/dL (ref 0.0–1.2)
CO2: 23 mmol/L (ref 20–29)
Calcium: 9.2 mg/dL (ref 8.7–10.2)
Chloride: 108 mmol/L — ABNORMAL HIGH (ref 96–106)
Creatinine, Ser: 1.1 mg/dL — ABNORMAL HIGH (ref 0.57–1.00)
Globulin, Total: 2.9 g/dL (ref 1.5–4.5)
Glucose: 116 mg/dL — ABNORMAL HIGH (ref 70–99)
Potassium: 4.4 mmol/L (ref 3.5–5.2)
Sodium: 145 mmol/L — ABNORMAL HIGH (ref 134–144)
Total Protein: 6.9 g/dL (ref 6.0–8.5)
eGFR: 62 mL/min/{1.73_m2} (ref >59)

## 2022-11-25 LAB — LIPID PANEL
Chol/HDL Ratio: 3.1 ratio (ref 0.0–4.4)
Cholesterol, Total: 88 mg/dL — ABNORMAL LOW (ref 100–199)
HDL: 28 mg/dL — ABNORMAL LOW (ref >39)
LDL Chol Calc (NIH): 29 mg/dL (ref 0–99)
Triglycerides: 189 mg/dL — ABNORMAL HIGH (ref 0–149)
VLDL Cholesterol Cal: 31 mg/dL (ref 5–40)

## 2022-11-25 NOTE — Progress Notes (Signed)
HI Breanna Rosario. It was nice to see you yesterday.  Your lab work looks good.  Your kidney function remains abnormal indication some chronic kidney disease.  Your glucose is also elevated.  I would like you to come back and have an A1c drawn which is a measure of diabetes.  This would just be a lab visit. No other concerns at this time. Continue with your current medication regimen.  Follow up as discussed.  Please let me know if you have any questions.

## 2023-01-30 ENCOUNTER — Other Ambulatory Visit: Payer: Self-pay | Admitting: Cardiovascular Disease

## 2023-01-30 DIAGNOSIS — E782 Mixed hyperlipidemia: Secondary | ICD-10-CM

## 2023-06-09 ENCOUNTER — Other Ambulatory Visit: Payer: Self-pay | Admitting: Nurse Practitioner

## 2023-06-09 ENCOUNTER — Other Ambulatory Visit: Payer: Self-pay | Admitting: Cardiovascular Disease

## 2023-06-09 NOTE — Telephone Encounter (Signed)
Patient is overdue for an appointment. Please call to schedule and then route to provider for refill.  

## 2023-06-09 NOTE — Telephone Encounter (Signed)
Scheduled patient on 06/29/2023 @ 1:40 pm.

## 2023-06-09 NOTE — Telephone Encounter (Signed)
Requested medication (s) are due for refill today: yes  Requested medication (s) are on the active medication list: yes  Last refill:  11/24/22 #90/1  Future visit scheduled: no  Notes to clinic:  Unable to refill per protocol due to failed labs, no updated results.      Requested Prescriptions  Pending Prescriptions Disp Refills   clopidogrel (PLAVIX) 75 MG tablet [Pharmacy Med Name: CLOPIDOGREL 75MG  TABLETS] 180 tablet 1    Sig: TAKE 1 TABLET BY MOUTH DAILY WITH BREAKFAST     Hematology: Antiplatelets - clopidogrel Failed - 06/09/2023 12:31 PM      Failed - HCT in normal range and within 180 days    Hematocrit  Date Value Ref Range Status  05/27/2022 35.7 34.0 - 46.6 % Final         Failed - HGB in normal range and within 180 days    Hemoglobin  Date Value Ref Range Status  05/27/2022 11.4 11.1 - 15.9 g/dL Final         Failed - PLT in normal range and within 180 days    Platelets  Date Value Ref Range Status  05/27/2022 228 150 - 450 x10E3/uL Final         Failed - Cr in normal range and within 360 days    Creatinine, Ser  Date Value Ref Range Status  11/24/2022 1.10 (H) 0.57 - 1.00 mg/dL Final         Failed - Valid encounter within last 6 months    Recent Outpatient Visits           6 months ago Moderate episode of recurrent major depressive disorder [facility])   [place] [facility] Larae Grooms, NP   8 months ago Chronic gout without tophus, unspecified cause, unspecified site   [facility] Larae Grooms, NP   9 months ago Acute gout of right foot, unspecified cause   [facility] [facility] Larae Grooms, NP   9 months ago Acute gout involving toe of left foot, unspecified cause   [facility] Larae Grooms, NP   10 months ago Acute gout involving toe of left foot, unspecified cause   [place] Larae Grooms, NP

## 2023-06-12 ENCOUNTER — Other Ambulatory Visit: Payer: Self-pay | Admitting: Nurse Practitioner

## 2023-06-13 NOTE — Telephone Encounter (Signed)
Requested by interface surescripts. Future visit in 2 weeks. #30 0 refills signed 06/12/23 . Needs to see PCP prior to 90 day supply . Requested Prescriptions  Refused Prescriptions Disp Refills   clopidogrel (PLAVIX) 75 MG tablet [Pharmacy Med Name: CLOPIDOGREL 75MG  TABLETS] 90 tablet     Sig: TAKE 1 TABLET BY MOUTH DAILY WITH BREAKFAST     Hematology: Antiplatelets - clopidogrel Failed - 06/13/2023 12:02 PM      Failed - HCT in normal range and within 180 days    Hematocrit  Date Value Ref Range Status  05/27/2022 35.7 34.0 - 46.6 % Final         Failed - HGB in normal range and within 180 days    Hemoglobin  Date Value Ref Range Status  05/27/2022 11.4 11.1 - 15.9 g/dL Final         Failed - PLT in normal range and within 180 days    Platelets  Date Value Ref Range Status  05/27/2022 228 150 - 450 x10E3/uL Final         Failed - Cr in normal range and within 360 days    Creatinine, Ser  Date Value Ref Range Status  11/24/2022 1.10 (H) 0.57 - 1.00 mg/dL Final         Failed - Valid encounter within last 6 months    Recent Outpatient Visits           6 months ago Moderate episode of recurrent major depressive disorder [facility])   [facility] Larae Grooms, NP   9 months ago Chronic gout without tophus, unspecified cause, unspecified site   [facility] Larae Grooms, NP   9 months ago Acute gout of right foot, unspecified cause   [place] [facility] Larae Grooms, NP   9 months ago Acute gout involving toe of left foot, unspecified cause   [place] Larae Grooms, NP   10 months ago Acute gout involving toe of left foot, unspecified cause   [facility] Larae Grooms, NP       Future Appointments             In 2 weeks Larae Grooms, NP [facility]

## 2023-06-25 ENCOUNTER — Other Ambulatory Visit: Payer: Self-pay | Admitting: Cardiovascular Disease

## 2023-06-25 DIAGNOSIS — E782 Mixed hyperlipidemia: Secondary | ICD-10-CM

## 2023-06-29 ENCOUNTER — Ambulatory Visit: Payer: 59 | Admitting: Nurse Practitioner

## 2023-06-29 ENCOUNTER — Other Ambulatory Visit: Payer: Self-pay | Admitting: Nurse Practitioner

## 2023-06-29 ENCOUNTER — Encounter: Payer: Self-pay | Admitting: Nurse Practitioner

## 2023-06-29 VITALS — BP 133/83 | HR 89 | Temp 98.4°F | Ht 65.0 in | Wt 338.8 lb

## 2023-06-29 DIAGNOSIS — Z0000 Encounter for general adult medical examination without abnormal findings: Secondary | ICD-10-CM | POA: Diagnosis not present

## 2023-06-29 DIAGNOSIS — R7309 Other abnormal glucose: Secondary | ICD-10-CM

## 2023-06-29 DIAGNOSIS — E66813 Obesity, class 3: Secondary | ICD-10-CM

## 2023-06-29 DIAGNOSIS — I129 Hypertensive chronic kidney disease with stage 1 through stage 4 chronic kidney disease, or unspecified chronic kidney disease: Secondary | ICD-10-CM | POA: Diagnosis not present

## 2023-06-29 DIAGNOSIS — F331 Major depressive disorder, recurrent, moderate: Secondary | ICD-10-CM

## 2023-06-29 DIAGNOSIS — E782 Mixed hyperlipidemia: Secondary | ICD-10-CM

## 2023-06-29 DIAGNOSIS — Z1211 Encounter for screening for malignant neoplasm of colon: Secondary | ICD-10-CM

## 2023-06-29 DIAGNOSIS — Z6841 Body Mass Index (BMI) 40.0 and over, adult: Secondary | ICD-10-CM

## 2023-06-29 DIAGNOSIS — Z1231 Encounter for screening mammogram for malignant neoplasm of breast: Secondary | ICD-10-CM

## 2023-06-29 DIAGNOSIS — J45909 Unspecified asthma, uncomplicated: Secondary | ICD-10-CM | POA: Diagnosis not present

## 2023-06-29 LAB — MICROSCOPIC EXAMINATION

## 2023-06-29 LAB — URINALYSIS, ROUTINE W REFLEX MICROSCOPIC
Bilirubin, UA: NEGATIVE
Glucose, UA: NEGATIVE
Ketones, UA: NEGATIVE
Leukocytes,UA: NEGATIVE
Nitrite, UA: NEGATIVE
RBC, UA: NEGATIVE
Specific Gravity, UA: 1.025 (ref 1.005–1.030)
Urobilinogen, Ur: 0.2 mg/dL (ref 0.2–1.0)
pH, UA: 6 (ref 5.0–7.5)

## 2023-06-29 MED ORDER — SERTRALINE HCL 100 MG PO TABS: 200.0000 mg | ORAL_TABLET | Freq: Every day | ORAL | 1 refills | Status: AC

## 2023-06-29 MED ORDER — CLOPIDOGREL BISULFATE 75 MG PO TABS: 75.0000 mg | ORAL_TABLET | Freq: Every day | ORAL | 1 refills | Status: DC

## 2023-06-29 MED ORDER — ATORVASTATIN CALCIUM 80 MG PO TABS: 80.0000 mg | ORAL_TABLET | Freq: Every day | ORAL | 1 refills | Status: AC

## 2023-06-29 MED ORDER — MONTELUKAST SODIUM 10 MG PO TABS: ORAL_TABLET | ORAL | 1 refills | Status: DC

## 2023-06-29 MED ORDER — METOPROLOL SUCCINATE ER 50 MG PO TB24: 50.0000 mg | ORAL_TABLET | Freq: Every day | ORAL | 1 refills | Status: DC

## 2023-06-29 MED ORDER — AMLODIPINE BESY-BENAZEPRIL HCL 5-10 MG PO CAPS: 1.0000 | ORAL_CAPSULE | Freq: Every day | ORAL | 1 refills | Status: DC

## 2023-06-29 NOTE — Patient Instructions (Signed)
 Please call to schedule your mammogram and/or bone density: [facility] at [facility]  Address: [address], [place], [place] [address] Phone: [phone]  [facility] [address] [place],  [place]  [address] Phone: [phone]

## 2023-06-29 NOTE — Assessment & Plan Note (Signed)
Chronic.  Controlled.  Continue with current medication regimen of Benazepril and Amlodipine. Refills sent today.   Labs ordered today.  Return to clinic in 6 months for reevaluation.  Call sooner if concerns arise.   

## 2023-06-29 NOTE — Progress Notes (Signed)
 BP 133/83 (BP Location: Left Arm, Patient Position: Sitting, Cuff Size: Large)   Pulse 89   Temp 98.4 F (36.9 C) (Oral)   Ht 5' 5 (1.651 m)   Wt (!) 338 lb 12.8 oz (153.7 kg)   SpO2 96%   BMI 56.38 kg/m    Subjective:    Patient ID: Breanna Rosario, female    DOB: 09/08/1975, 48 y.o.   MRN: [id number]  HPI: Breanna Rosario is a 48 y.o. female presenting on 06/29/2023 for comprehensive medical examination. Current medical complaints include: None  She currently lives with: Menopausal Symptoms: no  HYPERTENSION / HYPERLIPIDEMIA Satisfied with current treatment? yes Duration of hypertension: years BP monitoring frequency: not checking BP range:  BP medication side effects: no Past BP meds: amlodipine /benazepril  Duration of hyperlipidemia: years Cholesterol medication side effects: no Cholesterol supplements: none Past cholesterol medications: rosuvastatin  (crestor ) Medication compliance: excellent compliance Aspirin : no Recent stressors: no Recurrent headaches: no Visual changes: no Palpitations: no Dyspnea: no Chest pain: no Lower extremity edema: no Dizzy/lightheaded: no  MOOD Feels like she is doing well.  Continues on Zoloft  200mg  daily.  Denies concerns regarding medication at visit today.  Denies SI.  Depression Screen done today and results listed below:     06/29/2023    1:54 PM 11/24/2022   10:27 AM 08/04/2022    3:23 PM 05/27/2022   10:54 AM 06/29/2021    8:49 AM  Depression screen PHQ 2/9  Decreased Interest 0 1 1 1 1   Down, Depressed, Hopeless 1 2 1 2 1   PHQ - 2 Score 1 3 2 3 2   Altered sleeping 2 2 2 2 2   Tired, decreased energy 2 2 3 3 2   Change in appetite 1 2 1 1  0  Feeling bad or failure about yourself  1 1 2  0 1  Trouble concentrating 0 1 2 1 1   Moving slowly or fidgety/restless 0 0 0 0 0  Suicidal thoughts 0 0 0 0 0  PHQ-9 Score 7 11 12 10 8   Difficult doing work/chores Somewhat difficult Somewhat difficult Somewhat difficult  Somewhat  difficult    The patient does not have a history of falls. I did complete a risk assessment for falls. A plan of care for falls was documented.   Past Medical History:  Past Medical History:  Diagnosis Date   Allergic rhinitis    Heart attack (HCC) 03/2015   Hypertension     Surgical History:  Past Surgical History:  Procedure Laterality Date   CARDIAC CATHETERIZATION Right 03/17/2015   Procedure: Left Heart Cath and Coronary Angiography;  Surgeon: Denyse DELENA Bathe, MD;  Location: [facility] INVASIVE CV [facility];  Service: Cardiovascular;  Laterality: Right;   CARDIAC CATHETERIZATION N/A 03/18/2015   Procedure: Coronary Stent Intervention;  Surgeon: Cara JONETTA Lovelace, MD;  Location: [facility] INVASIVE CV LAB;  Service: Cardiovascular;  Laterality: N/A;   None      Medications:  Current Outpatient Medications on File Prior to Visit  Medication Sig   albuterol  (VENTOLIN  HFA) 108 (90 Base) MCG/ACT inhaler Inhale 2 puffs into the lungs every 6 (six) hours as needed for wheezing or shortness of breath.   aspirin  EC 81 MG tablet Take 81 mg by mouth daily.   fluticasone  furoate-vilanterol (BREO ELLIPTA ) 100-25 MCG/INH AEPB INHALE 1 PUFF INTO THE LUNGS DAILY   NEXLIZET 180-10 MG TABS TAKE 1 TABLET BY MOUTH DAILY   nitroGLYCERIN  (NITROSTAT ) 0.4 MG SL tablet Place under the tongue.  No current facility-administered medications on file prior to visit.    Allergies:  No Known Allergies  Social History:  Social History   Socioeconomic History   Marital status: Single    Spouse name: Not on file   Number of children: Not on file   Years of education: Not on file   Highest education level: Not on file  Occupational History   Not on file  Tobacco Use   Smoking status: Former   Smokeless tobacco: Never  Vaping Use   Vaping status: Never Used  Substance and Sexual Activity   Alcohol use: No   Drug use: No   Sexual activity: Yes    Birth control/protection: None  Other Topics Concern   Not on  file  Social History Narrative   Not on file   Social Drivers of Health   Financial Resource Strain: Unknown (04/24/2017)   Overall Financial Resource Strain (CARDIA)    Difficulty of Paying Living Expenses: Patient declined  Food Insecurity: Unknown (04/24/2017)   Hunger Vital Sign    Worried About Running Out of Food in the Last Year: Patient declined    Ran Out of Food in the Last Year: Patient declined  Transportation Needs: Unknown (04/24/2017)   PRAPARE - Administrator, Civil Service (Medical): Patient declined    Lack of Transportation (Non-Medical): Patient declined  Physical Activity: Unknown (04/24/2017)   Exercise Vital Sign    Days of Exercise per Week: Patient declined    Minutes of Exercise per Session: Patient declined  Stress: Not on file  Social Connections: Unknown (04/24/2017)   Social Connection and Isolation Panel [NHANES]    Frequency of Communication with Friends and Family: Patient declined    Frequency of Social Gatherings with Friends and Family: Patient declined    Attends Religious Services: Patient declined    Database Administrator or Organizations: Patient declined    Attends Banker Meetings: Patient declined    Marital Status: Patient declined  Intimate Partner Violence: Unknown (04/24/2017)   Humiliation, Afraid, Rape, and Kick questionnaire    Fear of Current or Ex-Partner: Patient declined    Emotionally Abused: Patient declined    Physically Abused: Patient declined    Sexually Abused: Patient declined   Social History   Tobacco Use  Smoking Status Former  Smokeless Tobacco Never   Social History   Substance and Sexual Activity  Alcohol Use No    Family History:  Family History  Problem Relation Age of Onset   Coronary artery disease Father        Died at age 103; four-vessel bypass at 48 years of age   Hypertension Father    Arthritis Mother    Cancer Mother    Migraines Mother     Past medical  history, surgical history, medications, allergies, family history and social history reviewed with patient today and changes made to appropriate areas of the chart.   Review of Systems  Eyes:  Negative for blurred vision and double vision.  Respiratory:  Negative for shortness of breath.   Cardiovascular:  Negative for chest pain, palpitations and leg swelling.  Musculoskeletal:        Right foot pain and swelling  Neurological:  Negative for dizziness and headaches.  Psychiatric/Behavioral:  Positive for depression. Negative for suicidal ideas. The patient is nervous/anxious.    All other ROS negative except what is listed above and in the HPI.      Objective:  BP 133/83 (BP Location: Left Arm, Patient Position: Sitting, Cuff Size: Large)   Pulse 89   Temp 98.4 F (36.9 C) (Oral)   Ht 5' 5 (1.651 m)   Wt (!) 338 lb 12.8 oz (153.7 kg)   SpO2 96%   BMI 56.38 kg/m   Wt Readings from Last 3 Encounters:  06/29/23 (!) 338 lb 12.8 oz (153.7 kg)  11/24/22 (!) 331 lb (150.1 kg)  09/15/22 (!) 322 lb 6.4 oz (146.2 kg)    Physical Exam Vitals and nursing note reviewed.  Constitutional:      General: She is awake. She is not in acute distress.    Appearance: Normal appearance. She is well-developed. She is obese. She is not ill-appearing.  HENT:     Head: Normocephalic and atraumatic.     Right Ear: Hearing, tympanic membrane, ear canal and external ear normal. No drainage.     Left Ear: Hearing, tympanic membrane, ear canal and external ear normal. No drainage.     Nose: Nose normal.     Right Sinus: No maxillary sinus tenderness or frontal sinus tenderness.     Left Sinus: No maxillary sinus tenderness or frontal sinus tenderness.     Mouth/Throat:     Mouth: Mucous membranes are moist.     Pharynx: Oropharynx is clear. Uvula midline. No pharyngeal swelling, oropharyngeal exudate or posterior oropharyngeal erythema.  Eyes:     General: Lids are normal.        Right eye: No  discharge.        Left eye: No discharge.     Extraocular Movements: Extraocular movements intact.     Conjunctiva/sclera: Conjunctivae normal.     Pupils: Pupils are equal, round, and reactive to light.     Visual Fields: Right eye visual fields normal and left eye visual fields normal.  Neck:     Thyroid : No thyromegaly.     Vascular: No carotid bruit.     Trachea: Trachea normal.  Cardiovascular:     Rate and Rhythm: Normal rate and regular rhythm.     Heart sounds: Normal heart sounds. No murmur heard.    No gallop.  Pulmonary:     Effort: Pulmonary effort is normal. No accessory muscle usage or respiratory distress.     Breath sounds: Normal breath sounds.  Chest:  Breasts:    Right: Normal.     Left: Normal.  Abdominal:     General: Bowel sounds are normal.     Palpations: Abdomen is soft. There is no hepatomegaly or splenomegaly.     Tenderness: There is no abdominal tenderness.  Musculoskeletal:     Cervical back: Normal range of motion and neck supple.     Right lower leg: No edema.     Left lower leg: No edema.     Right foot: Decreased range of motion. Normal capillary refill. Swelling and tenderness present. No bony tenderness.  Feet:     Right foot:     Skin integrity: Skin integrity normal.     Left foot:     Skin integrity: Skin integrity normal.  Lymphadenopathy:     Head:     Right side of head: No submental, submandibular, tonsillar, preauricular or posterior auricular adenopathy.     Left side of head: No submental, submandibular, tonsillar, preauricular or posterior auricular adenopathy.     Cervical: No cervical adenopathy.     Upper Body:     Right upper body: No supraclavicular, axillary or  pectoral adenopathy.     Left upper body: No supraclavicular, axillary or pectoral adenopathy.  Skin:    General: Skin is warm and dry.     Capillary Refill: Capillary refill takes less than 2 seconds.     Findings: No rash.  Neurological:     Mental Status:  She is alert and oriented to person, place, and time.     Gait: Gait is intact.  Psychiatric:        Attention and Perception: Attention normal.        Mood and Affect: Mood normal.        Speech: Speech normal.        Behavior: Behavior normal. Behavior is cooperative.        Thought Content: Thought content normal.        Judgment: Judgment normal.     Results for orders placed or performed in visit on 11/24/22  Comp Met (CMET)   Collection Time: 11/24/22 10:49 AM  Result Value Ref Range   Glucose 116 (H) 70 - 99 mg/dL   BUN 26 (H) 6 - 24 mg/dL   Creatinine, Ser 8.89 (H) 0.57 - 1.00 mg/dL   eGFR 62 >40 fO/fpw/8.26   BUN/Creatinine Ratio 24 (H) 9 - 23   Sodium 145 (H) 134 - 144 mmol/L   Potassium 4.4 3.5 - 5.2 mmol/L   Chloride 108 (H) 96 - 106 mmol/L   CO2 23 20 - 29 mmol/L   Calcium  9.2 8.7 - 10.2 mg/dL   Total Protein 6.9 6.0 - 8.5 g/dL   Albumin 4.0 3.9 - 4.9 g/dL   Globulin, Total 2.9 1.5 - 4.5 g/dL   Albumin/Globulin Ratio 1.4 1.2 - 2.2   Bilirubin Total 0.3 0.0 - 1.2 mg/dL   Alkaline Phosphatase 96 44 - 121 IU/L   AST 34 0 - 40 IU/L   ALT 27 0 - 32 IU/L  Lipid Profile   Collection Time: 11/24/22 10:49 AM  Result Value Ref Range   Cholesterol, Total 88 (L) 100 - 199 mg/dL   Triglycerides 810 (H) 0 - 149 mg/dL   HDL 28 (L) >60 mg/dL   VLDL Cholesterol Cal 31 5 - 40 mg/dL   LDL Chol Calc (NIH) 29 0 - 99 mg/dL   Chol/HDL Ratio 3.1 0.0 - 4.4 ratio      Assessment & Plan:   Problem List Items Addressed This Visit       Respiratory   Asthma due to environmental allergies   Relevant Medications   montelukast  (SINGULAIR ) 10 MG tablet     Genitourinary   Benign hypertensive renal disease   Chronic.  Controlled.  Continue with current medication regimen of Benazepril  and Amlodipine .  Refills sent today.  Labs ordered today.  Return to clinic in 6 months for reevaluation.  Call sooner if concerns arise.         Other   Major depression, recurrent (HCC)    Chronic.  Controlled.  Continue with current medication regimen of Zoloft  200mg  daily. Refills sent today.  Labs ordered today.  Return to clinic in 6 months for reevaluation.  Call sooner if concerns arise.        Relevant Medications   sertraline  (ZOLOFT ) 100 MG tablet   Class 3 severe obesity due to excess calories with body mass index (BMI) of 50.0 to 59.9 in adult [facility])   Recommended eating smaller high protein, low fat meals more frequently and exercising 30 mins a day 5 times a  week with a goal of 10-15lb weight loss in the next 3 months.       Mixed hyperlipidemia   Chronic.  Controlled.  Continue with current medication regimen of Atorvastatin  80mg .  Labs ordered today.  Return to clinic in 6 months for reevaluation.  Call sooner if concerns arise.        Relevant Medications   amLODipine -benazepril  (LOTREL) 5-10 MG capsule   atorvastatin  (LIPITOR) 80 MG tablet   metoprolol  succinate (TOPROL -XL) 50 MG 24 hr tablet   Other Relevant Orders   Lipid panel   Other Visit Diagnoses       Annual physical exam    -  Primary   Health maintenance reviewed during visit today.  Labs ordered.  Vaccines reviewed.  Mammogram and Colonoscopy ordered.   Relevant Orders   CBC with Differential/Platelet   Comprehensive metabolic panel   Lipid panel   TSH   Urinalysis, Routine w reflex microscopic     Screening for colon cancer       Relevant Orders   Cologuard     Encounter for screening mammogram for malignant neoplasm of breast       Relevant Orders   MM 3D SCREENING MAMMOGRAM BILATERAL BREAST        Follow up plan: Return in about 6 months (around 12/27/2023) for HTN, HLD, DM2 FU.   LABORATORY TESTING:  - Pap smear:  Will get at next visit  IMMUNIZATIONS:   - Tdap: Tetanus vaccination status reviewed: last tetanus booster within 10 years. - Influenza: Refused - Pneumovax: Not applicable - Prevnar: Not applicable - COVID: Not applicable - HPV: Not applicable - Shingrix  vaccine: Not applicable  SCREENING: -Mammogram: Ordered today   - Colonoscopy: Ordered today  - Bone Density: Not applicable  -Hearing Test: Not applicable  -Spirometry: Not applicable   PATIENT COUNSELING:   Advised to take 1 mg of folate supplement per day if capable of pregnancy.   Sexuality: Discussed sexually transmitted diseases, partner selection, use of condoms, avoidance of unintended pregnancy  and contraceptive alternatives.   Advised to avoid cigarette smoking.  I discussed with the patient that most people either abstain from alcohol or drink within safe limits (<=14/week and <=4 drinks/occasion for males, <=7/weeks and <= 3 drinks/occasion for females) and that the risk for alcohol disorders and other health effects rises proportionally with the number of drinks per week and how often a drinker exceeds daily limits.  Discussed cessation/primary prevention of drug use and availability of treatment for abuse.   Diet: Encouraged to adjust caloric intake to maintain  or achieve ideal body weight, to reduce intake of dietary saturated fat and total fat, to limit sodium intake by avoiding high sodium foods and not adding table salt, and to maintain adequate dietary potassium and calcium  preferably from fresh fruits, vegetables, and low-fat dairy products.    stressed the importance of regular exercise  Injury prevention: Discussed safety belts, safety helmets, smoke detector, smoking near bedding or upholstery.   Dental health: Discussed importance of regular tooth brushing, flossing, and dental visits.    NEXT PREVENTATIVE PHYSICAL DUE IN 1 YEAR. Return in about 6 months (around 12/27/2023) for HTN, HLD, DM2 FU.

## 2023-06-29 NOTE — Assessment & Plan Note (Signed)
 Recommended eating smaller high protein, low fat meals more frequently and exercising 30 mins a day 5 times a week with a goal of 10-15lb weight loss in the next 3 months.

## 2023-06-29 NOTE — Assessment & Plan Note (Signed)
Chronic.  Controlled.  Continue with current medication regimen of Atorvastatin 80mg.  Labs ordered today.  Return to clinic in 6 months for reevaluation.  Call sooner if concerns arise.   

## 2023-06-29 NOTE — Assessment & Plan Note (Signed)
Chronic.  Controlled.  Continue with current medication regimen of Zoloft '200mg'$  daily.  Refills sent today.  Labs ordered today.  Return to clinic in 6 months for reevaluation.  Call sooner if concerns arise.

## 2023-06-30 LAB — LIPID PANEL
Chol/HDL Ratio: 9.4 {ratio} — ABNORMAL HIGH (ref 0.0–4.4)
Cholesterol, Total: 245 mg/dL — ABNORMAL HIGH (ref 100–199)
HDL: 26 mg/dL — ABNORMAL LOW (ref >39)
LDL Chol Calc (NIH): 142 mg/dL — ABNORMAL HIGH (ref 0–99)
Triglycerides: 417 mg/dL — ABNORMAL HIGH (ref 0–149)
VLDL Cholesterol Cal: 77 mg/dL — ABNORMAL HIGH (ref 5–40)

## 2023-06-30 LAB — TSH: TSH: 2.27 u[IU]/mL (ref 0.450–4.500)

## 2023-06-30 LAB — CBC WITH DIFFERENTIAL/PLATELET
Basophils Absolute: 0 10*3/uL (ref 0.0–0.2)
Basos: 0 %
EOS (ABSOLUTE): 0.1 10*3/uL (ref 0.0–0.4)
Eos: 1 %
Hematocrit: 36.6 % (ref 34.0–46.6)
Hemoglobin: 11.8 g/dL (ref 11.1–15.9)
Immature Grans (Abs): 0 10*3/uL (ref 0.0–0.1)
Immature Granulocytes: 1 %
Lymphocytes Absolute: 2.1 10*3/uL (ref 0.7–3.1)
Lymphs: 25 %
MCH: 28.9 pg (ref 26.6–33.0)
MCHC: 32.2 g/dL (ref 31.5–35.7)
MCV: 90 fL (ref 79–97)
Monocytes Absolute: 0.5 10*3/uL (ref 0.1–0.9)
Monocytes: 6 %
Neutrophils Absolute: 5.6 10*3/uL (ref 1.4–7.0)
Neutrophils: 67 %
Platelets: 187 10*3/uL (ref 150–450)
RBC: 4.09 x10E6/uL (ref 3.77–5.28)
RDW: 13.9 % (ref 11.7–15.4)
WBC: 8.3 10*3/uL (ref 3.4–10.8)

## 2023-06-30 LAB — COMPREHENSIVE METABOLIC PANEL
ALT: 34 [IU]/L — ABNORMAL HIGH (ref 0–32)
AST: 39 [IU]/L (ref 0–40)
Albumin: 4 g/dL (ref 3.9–4.9)
Alkaline Phosphatase: 94 [IU]/L (ref 44–121)
BUN/Creatinine Ratio: 21 (ref 9–23)
BUN: 23 mg/dL (ref 6–24)
Bilirubin Total: <0.2 mg/dL (ref 0.0–1.2)
CO2: 23 mmol/L (ref 20–29)
Calcium: 8.8 mg/dL (ref 8.7–10.2)
Chloride: 103 mmol/L (ref 96–106)
Creatinine, Ser: 1.07 mg/dL — ABNORMAL HIGH (ref 0.57–1.00)
Globulin, Total: 2.8 g/dL (ref 1.5–4.5)
Glucose: 131 mg/dL — ABNORMAL HIGH (ref 70–99)
Potassium: 4.3 mmol/L (ref 3.5–5.2)
Sodium: 140 mmol/L (ref 134–144)
Total Protein: 6.8 g/dL (ref 6.0–8.5)
eGFR: 64 mL/min/{1.73_m2} (ref >59)

## 2023-06-30 NOTE — Addendum Note (Signed)
 Addended by: Larae Grooms on: 06/30/2023 08:15 AM   Modules accepted: Orders

## 2023-07-01 LAB — SPECIMEN STATUS REPORT

## 2023-07-01 LAB — HEMOGLOBIN A1C
Est. average glucose Bld gHb Est-mCnc: 123 mg/dL
Hgb A1c MFr Bld: 5.9 % — ABNORMAL HIGH (ref 4.8–5.6)

## 2023-07-03 NOTE — Telephone Encounter (Signed)
 Reordered 06/29/23 #180 1 RF  Requested Prescriptions  Refused Prescriptions Disp Refills   sertraline  (ZOLOFT ) 100 MG tablet [Pharmacy Med Name: SERTRALINE  100MG  TABLETS] 135 tablet     Sig: TAKE 1 AND 1/2 TABLETS BY MOUTH DAILY     Psychiatry:  Antidepressants - SSRI - sertraline  Failed - 07/03/2023 11:32 AM      Failed - ALT in normal range and within 360 days    ALT  Date Value Ref Range Status  06/29/2023 34 (H) 0 - 32 IU/L Final         Passed - AST in normal range and within 360 days    AST  Date Value Ref Range Status  06/29/2023 39 0 - 40 IU/L Final         Passed - Completed PHQ-2 or PHQ-9 in the last 360 days      Passed - Valid encounter within last 6 months    Recent Outpatient Visits           4 days ago Annual physical exam   [place] [facility] Melvin Pao, NP   7 months ago Moderate episode of recurrent major depressive disorder [facility])   [place] [facility] Melvin Pao, NP   9 months ago Chronic gout without tophus, unspecified cause, unspecified site   [facility] Melvin Pao, NP   10 months ago Acute gout of right foot, unspecified cause   [place] [facility] Melvin Pao, NP   10 months ago Acute gout involving toe of left foot, unspecified cause   [place] [facility] Melvin Pao, NP       Future Appointments             In 6 months Melvin Pao, NP [place] [facility]

## 2023-09-12 NOTE — Progress Notes (Unsigned)
 There were no vitals taken for this visit.   Subjective:    Patient ID: Breanna Rosario, female    DOB: 22-Feb-1976, 48 y.o.   MRN: [id number]  HPI: Breanna Rosario is a 48 y.o. female  No chief complaint on file.  GOUT Duration:months Right 1st metatarsophalangeal pain: no Left 1st metatarsophalangeal pain: no Right knee pain: no Left knee pain: no Severity:  none   Quality:  none Swelling: no Redness: no Trauma: no Recent dietary change or indiscretion: no Fevers: no Nausea/vomiting: no Status:  better Patient states she is not taking the allopurinol because she had a flare and then was nervous to restart.  She does need FMLA just to protect her job.  She has drastically changed her diet and this seems to be helping with her gout.    HYPERTENSION without Chronic Kidney Disease Elevated at last couple of visits.  Would like to work on diet and exercise before increasing dose of medication.   Denies HA, CP, SOB, dizziness, palpitations, visual changes, and lower extremity swelling.    Relevant past medical, surgical, family and social history reviewed and updated as indicated. Interim medical history since our last visit reviewed. Allergies and medications reviewed and updated.  Review of Systems  Eyes:  Negative for visual disturbance.  Respiratory:  Negative for cough, chest tightness and shortness of breath.   Cardiovascular:  Negative for chest pain, palpitations and leg swelling.  Musculoskeletal:        Not currently having gout symptoms.  Neurological:  Negative for dizziness and headaches.    Per HPI unless specifically indicated above     Objective:    There were no vitals taken for this visit.  Wt Readings from Last 3 Encounters:  06/29/23 (!) 338 lb 12.8 oz (153.7 kg)  11/24/22 (!) 331 lb (150.1 kg)  09/15/22 (!) 322 lb 6.4 oz (146.2 kg)    Physical Exam Vitals and nursing note reviewed.  Constitutional:      General: She is not in acute  distress.    Appearance: Normal appearance. She is normal weight. She is not ill-appearing, toxic-appearing or diaphoretic.  HENT:     Head: Normocephalic.     Right Ear: External ear normal.     Left Ear: External ear normal.     Nose: Nose normal.     Mouth/Throat:     Mouth: Mucous membranes are moist.     Pharynx: Oropharynx is clear.  Eyes:     General:        Right eye: No discharge.        Left eye: No discharge.     Extraocular Movements: Extraocular movements intact.     Conjunctiva/sclera: Conjunctivae normal.     Pupils: Pupils are equal, round, and reactive to light.  Cardiovascular:     Rate and Rhythm: Normal rate and regular rhythm.     Heart sounds: No murmur heard. Pulmonary:     Effort: Pulmonary effort is normal. No respiratory distress.     Breath sounds: Normal breath sounds. No wheezing or rales.  Musculoskeletal:     Cervical back: Normal range of motion and neck supple.  Skin:    General: Skin is warm and dry.     Capillary Refill: Capillary refill takes less than 2 seconds.  Neurological:     General: No focal deficit present.     Mental Status: She is alert and oriented to person, place, and time. Mental status is  at baseline.  Psychiatric:        Mood and Affect: Mood normal.        Behavior: Behavior normal.        Thought Content: Thought content normal.        Judgment: Judgment normal.    Results for orders placed or performed in visit on 06/29/23  Microscopic Examination   Collection Time: 06/29/23  2:05 PM   Urine  Result Value Ref Range   WBC, UA 0-5 0 - 5 /hpf   RBC, Urine 0-2 0 - 2 /hpf   Epithelial Cells (non renal) 0-10 0 - 10 /hpf   Bacteria, UA Few None seen/Few  Urinalysis, Routine w reflex microscopic   Collection Time: 06/29/23  2:05 PM  Result Value Ref Range   Specific Gravity, UA 1.025 1.005 - 1.030   pH, UA 6.0 5.0 - 7.5   Color, UA Yellow Yellow   Appearance Ur Cloudy (A) Clear   Leukocytes,UA Negative Negative    Protein,UA 3+ (A) Negative/Trace   Glucose, UA Negative Negative   Ketones, UA Negative Negative   RBC, UA Negative Negative   Bilirubin, UA Negative Negative   Urobilinogen, Ur 0.2 0.2 - 1.0 mg/dL   Nitrite, UA Negative Negative   Microscopic Examination See below:   CBC with Differential/Platelet   Collection Time: 06/29/23  2:06 PM  Result Value Ref Range   WBC 8.3 3.4 - 10.8 x10E3/uL   RBC 4.09 3.77 - 5.28 x10E6/uL   Hemoglobin 11.8 11.1 - 15.9 g/dL   Hematocrit 14.7 82.9 - 46.6 %   MCV 90 79 - 97 fL   MCH 28.9 26.6 - 33.0 pg   MCHC 32.2 31.5 - 35.7 g/dL   RDW 56.2 13.0 - 86.5 %   Platelets 187 150 - 450 x10E3/uL   Neutrophils 67 Not Estab. %   Lymphs 25 Not Estab. %   Monocytes 6 Not Estab. %   Eos 1 Not Estab. %   Basos 0 Not Estab. %   Neutrophils Absolute 5.6 1.4 - 7.0 x10E3/uL   Lymphocytes Absolute 2.1 0.7 - 3.1 x10E3/uL   Monocytes Absolute 0.5 0.1 - 0.9 x10E3/uL   EOS (ABSOLUTE) 0.1 0.0 - 0.4 x10E3/uL   Basophils Absolute 0.0 0.0 - 0.2 x10E3/uL   Immature Granulocytes 1 Not Estab. %   Immature Grans (Abs) 0.0 0.0 - 0.1 x10E3/uL  Comprehensive metabolic panel   Collection Time: 06/29/23  2:06 PM  Result Value Ref Range   Glucose 131 (H) 70 - 99 mg/dL   BUN 23 6 - 24 mg/dL   Creatinine, Ser 7.84 (H) 0.57 - 1.00 mg/dL   eGFR 64 >69 GE/XBM/8.41   BUN/Creatinine Ratio 21 9 - 23   Sodium 140 134 - 144 mmol/L   Potassium 4.3 3.5 - 5.2 mmol/L   Chloride 103 96 - 106 mmol/L   CO2 23 20 - 29 mmol/L   Calcium 8.8 8.7 - 10.2 mg/dL   Total Protein 6.8 6.0 - 8.5 g/dL   Albumin 4.0 3.9 - 4.9 g/dL   Globulin, Total 2.8 1.5 - 4.5 g/dL   Bilirubin Total <3.2 0.0 - 1.2 mg/dL   Alkaline Phosphatase 94 44 - 121 IU/L   AST 39 0 - 40 IU/L   ALT 34 (H) 0 - 32 IU/L  Lipid panel   Collection Time: 06/29/23  2:06 PM  Result Value Ref Range   Cholesterol, Total 245 (H) 100 - 199 mg/dL   Triglycerides 440 (H)  0 - 149 mg/dL   HDL 26 (L) >40 mg/dL   VLDL Cholesterol Cal 77 (H)  5 - 40 mg/dL   LDL Chol Calc (NIH) 981 (H) 0 - 99 mg/dL   Chol/HDL Ratio 9.4 (H) 0.0 - 4.4 ratio  TSH   Collection Time: 06/29/23  2:06 PM  Result Value Ref Range   TSH 2.270 0.450 - 4.500 uIU/mL  Hemoglobin A1c   Collection Time: 06/29/23  2:06 PM  Result Value Ref Range   Hgb A1c MFr Bld 5.9 (H) 4.8 - 5.6 %   Est. average glucose Bld gHb Est-mCnc 123 mg/dL  Specimen status report   Collection Time: 06/29/23  2:06 PM  Result Value Ref Range   specimen status report Comment       Assessment & Plan:   Problem List Items Addressed This Visit   None     Follow up plan: No follow-ups on file.

## 2023-09-13 ENCOUNTER — Encounter: Payer: Self-pay | Admitting: Nurse Practitioner

## 2023-09-13 ENCOUNTER — Ambulatory Visit: Payer: Self-pay | Admitting: Nurse Practitioner

## 2023-09-13 VITALS — BP 112/74 | HR 82 | Ht 65.0 in | Wt 334.6 lb

## 2023-09-13 DIAGNOSIS — Z0289 Encounter for other administrative examinations: Secondary | ICD-10-CM

## 2023-09-13 DIAGNOSIS — M109 Gout, unspecified: Secondary | ICD-10-CM

## 2023-10-15 ENCOUNTER — Other Ambulatory Visit: Payer: Self-pay | Admitting: Nurse Practitioner

## 2023-10-17 NOTE — Telephone Encounter (Signed)
 Unable to refill per protocol, Rx expired. Discontinued 06/09/23.  Requested Prescriptions  Pending Prescriptions Disp Refills   rosuvastatin  (CRESTOR ) 40 MG tablet [Pharmacy Med Name: ROSUVASTATIN  40MG  TABLETS] 90 tablet 1    Sig: TAKE 1 TABLET(40 MG) BY MOUTH DAILY     Cardiovascular:  Antilipid - Statins 2 Failed - 10/17/2023  1:00 PM      Failed - Cr in normal range and within 360 days    Creatinine, Ser  Date Value Ref Range Status  06/29/2023 1.07 (H) 0.57 - 1.00 mg/dL Final         Failed - Valid encounter within last 12 months    Recent Outpatient Visits           1 month ago Acute gout of right foot, unspecified cause   [place] [facility] Aileen Alexanders, NP       Future Appointments             In 2 months Aileen Alexanders, NP [facility]            Failed - Lipid Panel in normal range within the last 12 months    Cholesterol, Total  Date Value Ref Range Status  06/29/2023 245 (H) 100 - 199 mg/dL Final   LDL Chol Calc (NIH)  Date Value Ref Range Status  06/29/2023 142 (H) 0 - 99 mg/dL Final   HDL  Date Value Ref Range Status  06/29/2023 26 (L) >39 mg/dL Final   Triglycerides  Date Value Ref Range Status  06/29/2023 417 (H) 0 - 149 mg/dL Final         Passed - Patient is not pregnant

## 2023-10-19 ENCOUNTER — Other Ambulatory Visit: Payer: Self-pay | Admitting: Nurse Practitioner

## 2023-10-19 DIAGNOSIS — J45909 Unspecified asthma, uncomplicated: Secondary | ICD-10-CM

## 2023-10-23 NOTE — Telephone Encounter (Signed)
 Requested Prescriptions  Pending Prescriptions Disp Refills   montelukast  (SINGULAIR ) 10 MG tablet [Pharmacy Med Name: MONTELUKAST  10MG  TABLETS] 90 tablet 0    Sig: TAKE 1 TABLET(10 MG) BY MOUTH AT BEDTIME     There is no refill protocol information for this order     amLODipine -benazepril  (LOTREL) 5-10 MG capsule [Pharmacy Med Name: AMLODIPINE -BENAZ 5/10MG  CAPSULES] 90 capsule 0    Sig: TAKE 1 CAPSULE BY MOUTH DAILY     There is no refill protocol information for this order

## 2024-01-02 ENCOUNTER — Encounter: Payer: Self-pay | Admitting: Nurse Practitioner

## 2024-01-02 ENCOUNTER — Ambulatory Visit: Payer: Self-pay | Admitting: Nurse Practitioner

## 2024-01-02 VITALS — BP 131/68 | HR 66 | Temp 98.6°F | Wt 329.0 lb

## 2024-01-02 DIAGNOSIS — E782 Mixed hyperlipidemia: Secondary | ICD-10-CM | POA: Diagnosis not present

## 2024-01-02 DIAGNOSIS — F331 Major depressive disorder, recurrent, moderate: Secondary | ICD-10-CM | POA: Diagnosis not present

## 2024-01-02 DIAGNOSIS — R7303 Prediabetes: Secondary | ICD-10-CM | POA: Insufficient documentation

## 2024-01-02 DIAGNOSIS — I129 Hypertensive chronic kidney disease with stage 1 through stage 4 chronic kidney disease, or unspecified chronic kidney disease: Secondary | ICD-10-CM

## 2024-01-02 DIAGNOSIS — M109 Gout, unspecified: Secondary | ICD-10-CM

## 2024-01-02 MED ORDER — TRAZODONE HCL 50 MG PO TABS: 25.0000 mg | ORAL_TABLET | Freq: Every evening | ORAL | 3 refills | Status: AC | PRN

## 2024-01-02 NOTE — Assessment & Plan Note (Signed)
Chronic.  Controlled.  Continue with current medication regimen of Benazepril and Amlodipine. Refills sent today.   Labs ordered today.  Return to clinic in 6 months for reevaluation.  Call sooner if concerns arise.   

## 2024-01-02 NOTE — Assessment & Plan Note (Signed)
Chronic.  Controlled.  Continue with current medication regimen of Zoloft '200mg'$  daily.  Refills sent today.  Labs ordered today.  Return to clinic in 6 months for reevaluation.  Call sooner if concerns arise.

## 2024-01-02 NOTE — Assessment & Plan Note (Signed)
Chronic.  Controlled.  Continue with current medication regimen of Atorvastatin 80mg.  Labs ordered today.  Return to clinic in 6 months for reevaluation.  Call sooner if concerns arise.   

## 2024-01-02 NOTE — Progress Notes (Signed)
 BP 131/68   Pulse 66   Temp 98.6 F (37 C) (Oral)   Wt (!) 329 lb (149.2 kg)   SpO2 98%   BMI 54.75 kg/m    Subjective:    Patient ID: Breanna Rosario, female    DOB: [birth year], 48 y.o.   MRN: [id number]  HPI: Breanna Rosario is a 48 y.o. female  Chief Complaint  Patient presents with   Gout    Recurrent issue    HYPERTENSION / HYPERLIPIDEMIA Satisfied with current treatment? yes Duration of hypertension: years BP monitoring frequency: not checking BP range:  BP medication side effects: no Past BP meds: amlodipine /benazepril  Duration of hyperlipidemia: years Cholesterol medication side effects: no Cholesterol supplements: none Past cholesterol medications: rosuvastatin  (crestor ) Medication compliance: excellent compliance Aspirin : no Recent stressors: no Recurrent headaches: no Visual changes: no Palpitations: no Dyspnea: no Chest pain: no Lower extremity edema: no Dizzy/lightheaded: no  MOOD Feels like she is doing well.  Continues on Zoloft  200mg  daily.  Denies concerns regarding medication at visit today.  Denies SI.  Patient has had recurrent gout flares.  Had one as recently as last week.  She tried taking allopurinol  but it made her gout worse.  She couldn't sleep during this time which was the worst symptom.  Would like to try the allopurinol  again.  She is trying to change her diet.    Relevant past medical, surgical, family and social history reviewed and updated as indicated. Interim medical history since our last visit reviewed. Allergies and medications reviewed and updated.  Review of Systems  Eyes:  Negative for visual disturbance.  Respiratory:  Negative for cough, chest tightness and shortness of breath.   Cardiovascular:  Negative for chest pain, palpitations and leg swelling.  Musculoskeletal:  Positive for arthralgias.  Neurological:  Negative for dizziness and headaches.    Per HPI unless specifically indicated above      Objective:    BP 131/68   Pulse 66   Temp 98.6 F (37 C) (Oral)   Wt (!) 329 lb (149.2 kg)   SpO2 98%   BMI 54.75 kg/m   Wt Readings from Last 3 Encounters:  01/02/24 (!) 329 lb (149.2 kg)  09/13/23 (!) 334 lb 9.6 oz (151.8 kg)  06/29/23 (!) 338 lb 12.8 oz (153.7 kg)    Physical Exam Vitals and nursing note reviewed.  Constitutional:      General: She is not in acute distress.    Appearance: Normal appearance. She is obese. She is not ill-appearing, toxic-appearing or diaphoretic.  HENT:     Head: Normocephalic.     Right Ear: External ear normal.     Left Ear: External ear normal.     Nose: Nose normal.     Mouth/Throat:     Mouth: Mucous membranes are moist.     Pharynx: Oropharynx is clear.  Eyes:     General:        Right eye: No discharge.        Left eye: No discharge.     Extraocular Movements: Extraocular movements intact.     Conjunctiva/sclera: Conjunctivae normal.     Pupils: Pupils are equal, round, and reactive to light.  Cardiovascular:     Rate and Rhythm: Normal rate and regular rhythm.     Heart sounds: No murmur heard. Pulmonary:     Effort: Pulmonary effort is normal. No respiratory distress.     Breath sounds: Normal breath sounds. No wheezing or rales.  Musculoskeletal:     Cervical back: Normal range of motion and neck supple.  Skin:    General: Skin is warm and dry.     Capillary Refill: Capillary refill takes less than 2 seconds.  Neurological:     General: No focal deficit present.     Mental Status: She is alert and oriented to person, place, and time. Mental status is at baseline.  Psychiatric:        Mood and Affect: Mood normal.        Behavior: Behavior normal.        Thought Content: Thought content normal.        Judgment: Judgment normal.     Results for orders placed or performed in visit on 06/29/23  Microscopic Examination   Collection Time: 06/29/23  2:05 PM   Urine  Result Value Ref Range   WBC, UA 0-5 0 - 5 /hpf    RBC, Urine 0-2 0 - 2 /hpf   Epithelial Cells (non renal) 0-10 0 - 10 /hpf   Bacteria, UA Few None seen/Few  Urinalysis, Routine w reflex microscopic   Collection Time: 06/29/23  2:05 PM  Result Value Ref Range   Specific Gravity, UA 1.025 1.005 - 1.030   pH, UA 6.0 5.0 - 7.5   Color, UA Yellow Yellow   Appearance Ur Cloudy (A) Clear   Leukocytes,UA Negative Negative   Protein,UA 3+ (A) Negative/Trace   Glucose, UA Negative Negative   Ketones, UA Negative Negative   RBC, UA Negative Negative   Bilirubin, UA Negative Negative   Urobilinogen, Ur 0.2 0.2 - 1.0 mg/dL   Nitrite, UA Negative Negative   Microscopic Examination See below:   CBC with Differential/Platelet   Collection Time: 06/29/23  2:06 PM  Result Value Ref Range   WBC 8.3 3.4 - 10.8 x10E3/uL   RBC 4.09 3.77 - 5.28 x10E6/uL   Hemoglobin 11.8 11.1 - 15.9 g/dL   Hematocrit 63.3 65.9 - 46.6 %   MCV 90 79 - 97 fL   MCH 28.9 26.6 - 33.0 pg   MCHC 32.2 31.5 - 35.7 g/dL   RDW 86.0 88.2 - 84.5 %   Platelets 187 150 - 450 x10E3/uL   Neutrophils 67 Not Estab. %   Lymphs 25 Not Estab. %   Monocytes 6 Not Estab. %   Eos 1 Not Estab. %   Basos 0 Not Estab. %   Neutrophils Absolute 5.6 1.4 - 7.0 x10E3/uL   Lymphocytes Absolute 2.1 0.7 - 3.1 x10E3/uL   Monocytes Absolute 0.5 0.1 - 0.9 x10E3/uL   EOS (ABSOLUTE) 0.1 0.0 - 0.4 x10E3/uL   Basophils Absolute 0.0 0.0 - 0.2 x10E3/uL   Immature Granulocytes 1 Not Estab. %   Immature Grans (Abs) 0.0 0.0 - 0.1 x10E3/uL  Comprehensive metabolic panel   Collection Time: 06/29/23  2:06 PM  Result Value Ref Range   Glucose 131 (H) 70 - 99 mg/dL   BUN 23 6 - 24 mg/dL   Creatinine, Ser 8.92 (H) 0.57 - 1.00 mg/dL   eGFR 64 >40 fO/fpw/8.26   BUN/Creatinine Ratio 21 9 - 23   Sodium 140 134 - 144 mmol/L   Potassium 4.3 3.5 - 5.2 mmol/L   Chloride 103 96 - 106 mmol/L   CO2 23 20 - 29 mmol/L   Calcium  8.8 8.7 - 10.2 mg/dL   Total Protein 6.8 6.0 - 8.5 g/dL   Albumin 4.0 3.9 - 4.9 g/dL    Globulin, Total 2.8  1.5 - 4.5 g/dL   Bilirubin Total <9.7 0.0 - 1.2 mg/dL   Alkaline Phosphatase 94 44 - 121 IU/L   AST 39 0 - 40 IU/L   ALT 34 (H) 0 - 32 IU/L  Lipid panel   Collection Time: 06/29/23  2:06 PM  Result Value Ref Range   Cholesterol, Total 245 (H) 100 - 199 mg/dL   Triglycerides 582 (H) 0 - 149 mg/dL   HDL 26 (L) >60 mg/dL   VLDL Cholesterol Cal 77 (H) 5 - 40 mg/dL   LDL Chol Calc (NIH) 857 (H) 0 - 99 mg/dL   Chol/HDL Ratio 9.4 (H) 0.0 - 4.4 ratio  TSH   Collection Time: 06/29/23  2:06 PM  Result Value Ref Range   TSH 2.270 0.450 - 4.500 uIU/mL  Hemoglobin A1c   Collection Time: 06/29/23  2:06 PM  Result Value Ref Range   Hgb A1c MFr Bld 5.9 (H) 4.8 - 5.6 %   Est. average glucose Bld gHb Est-mCnc 123 mg/dL  Specimen status report   Collection Time: 06/29/23  2:06 PM  Result Value Ref Range   specimen status report Comment       Assessment & Plan:   Problem List Items Addressed This Visit       Genitourinary   Benign hypertensive renal disease   Chronic.  Controlled.  Continue with current medication regimen of Benazepril  and Amlodipine .  Refills sent today.  Labs ordered today.  Return to clinic in 6 months for reevaluation.  Call sooner if concerns arise.         Other   Major depression, recurrent (HCC) - Primary   Chronic.  Controlled.  Continue with current medication regimen of Zoloft  200mg  daily. Refills sent today.  Labs ordered today.  Return to clinic in 6 months for reevaluation.  Call sooner if concerns arise.       Relevant Medications   traZODone  (DESYREL ) 50 MG tablet   Other Relevant Orders   Comprehensive metabolic panel with GFR   Mixed hyperlipidemia   Chronic.  Controlled.  Continue with current medication regimen of Atorvastatin  80mg .  Labs ordered today.  Return to clinic in 6 months for reevaluation.  Call sooner if concerns arise.       Relevant Orders   Lipid panel   Other Visit Diagnoses       Prediabetes     It was  nice to see you yesterday.  Your lab work looks good.  No concerns at this time. Continue with your current medication regimen.  Follow up as discussed.  Please let me know if you have any questions.     Relevant Orders   Hemoglobin A1c     Acute gout of right foot, unspecified cause       Not well controlled. Has had 3 gout flares since April. Will check uric acid levels and restart allopurinol . Will send trazodone  to help with sleep during the adjustment period.  Can also consider treatment with cholchicine if patient experiences another flare.    Relevant Orders   Uric acid        Follow up plan: Return in about 6 months (around 07/04/2024) for Physical and Fasting labs.

## 2024-01-03 ENCOUNTER — Ambulatory Visit: Payer: Self-pay | Admitting: Nurse Practitioner

## 2024-01-03 DIAGNOSIS — I129 Hypertensive chronic kidney disease with stage 1 through stage 4 chronic kidney disease, or unspecified chronic kidney disease: Secondary | ICD-10-CM

## 2024-01-03 LAB — COMPREHENSIVE METABOLIC PANEL WITH GFR
ALT: 16 IU/L (ref 0–32)
AST: 24 IU/L (ref 0–40)
Albumin: 4.2 g/dL (ref 3.9–4.9)
Alkaline Phosphatase: 100 IU/L (ref 44–121)
BUN/Creatinine Ratio: 26 — ABNORMAL HIGH (ref 9–23)
BUN: 37 mg/dL — ABNORMAL HIGH (ref 6–24)
Bilirubin Total: <0.2 mg/dL (ref 0.0–1.2)
CO2: 16 mmol/L — ABNORMAL LOW (ref 20–29)
Calcium: 9.2 mg/dL (ref 8.7–10.2)
Chloride: 107 mmol/L — ABNORMAL HIGH (ref 96–106)
Creatinine, Ser: 1.4 mg/dL — ABNORMAL HIGH (ref 0.57–1.00)
Globulin, Total: 3.4 g/dL (ref 1.5–4.5)
Glucose: 92 mg/dL (ref 70–99)
Potassium: 5 mmol/L (ref 3.5–5.2)
Sodium: 140 mmol/L (ref 134–144)
Total Protein: 7.6 g/dL (ref 6.0–8.5)
eGFR: 46 mL/min/1.73 — ABNORMAL LOW (ref >59)

## 2024-01-03 LAB — LIPID PANEL
Chol/HDL Ratio: 3.4 ratio (ref 0.0–4.4)
Cholesterol, Total: 78 mg/dL — ABNORMAL LOW (ref 100–199)
HDL: 23 mg/dL — ABNORMAL LOW (ref >39)
LDL Chol Calc (NIH): 25 mg/dL (ref 0–99)
Triglycerides: 181 mg/dL — ABNORMAL HIGH (ref 0–149)
VLDL Cholesterol Cal: 30 mg/dL (ref 5–40)

## 2024-01-03 LAB — HEMOGLOBIN A1C
Est. average glucose Bld gHb Est-mCnc: 120 mg/dL
Hgb A1c MFr Bld: 5.8 % — ABNORMAL HIGH (ref 4.8–5.6)

## 2024-01-03 LAB — URIC ACID: Uric Acid: 10.6 mg/dL — ABNORMAL HIGH (ref 2.6–6.2)

## 2024-02-02 ENCOUNTER — Other Ambulatory Visit: Payer: Self-pay | Admitting: Cardiovascular Disease

## 2024-02-02 DIAGNOSIS — E782 Mixed hyperlipidemia: Secondary | ICD-10-CM

## 2024-02-16 ENCOUNTER — Other Ambulatory Visit: Payer: Self-pay | Admitting: Nurse Practitioner

## 2024-02-19 NOTE — Telephone Encounter (Signed)
 Requested medication (s) are due for refill today -yes  Requested medication (s) are on the active medication list -yes  Future visit scheduled -no  Last refill: 06/29/23 #90 1RF  Notes to clinic: fails lab protocol-6 month- 06/29/23  Requested Prescriptions  Pending Prescriptions Disp Refills   clopidogrel  (PLAVIX ) 75 MG tablet [Pharmacy Med Name: CLOPIDOGREL  75MG  TABLETS] 180 tablet     Sig: TAKE 1 TABLET(75 MG TOTAL) BY MOUTH DAILY WITH BREAKFAST     Hematology: Antiplatelets - clopidogrel  Failed - 02/19/2024  6:31 AM      Failed - HCT in normal range and within 180 days    Hematocrit  Date Value Ref Range Status  06/29/2023 36.6 34.0 - 46.6 % Final         Failed - HGB in normal range and within 180 days    Hemoglobin  Date Value Ref Range Status  06/29/2023 11.8 11.1 - 15.9 g/dL Final         Failed - PLT in normal range and within 180 days    Platelets  Date Value Ref Range Status  06/29/2023 187 150 - 450 x10E3/uL Final         Failed - Cr in normal range and within 360 days    Creatinine, Ser  Date Value Ref Range Status  01/02/2024 1.40 (H) 0.57 - 1.00 mg/dL Final         Passed - Valid encounter within last 6 months    Recent Outpatient Visits           1 month ago Moderate episode of recurrent major depressive disorder (HCC)   [place] [facility] Melvin Pao, NP   5 months ago Acute gout of right foot, unspecified cause   [place] [facility] Melvin Pao, NP                 Requested Prescriptions  Pending Prescriptions Disp Refills   clopidogrel  (PLAVIX ) 75 MG tablet [Pharmacy Med Name: CLOPIDOGREL  75MG  TABLETS] 180 tablet     Sig: TAKE 1 TABLET(75 MG TOTAL) BY MOUTH DAILY WITH BREAKFAST     Hematology: Antiplatelets - clopidogrel  Failed - 02/19/2024  6:31 AM      Failed - HCT in normal range and within 180 days    Hematocrit  Date Value Ref Range Status  06/29/2023 36.6 34.0 - 46.6 % Final          Failed - HGB in normal range and within 180 days    Hemoglobin  Date Value Ref Range Status  06/29/2023 11.8 11.1 - 15.9 g/dL Final         Failed - PLT in normal range and within 180 days    Platelets  Date Value Ref Range Status  06/29/2023 187 150 - 450 x10E3/uL Final         Failed - Cr in normal range and within 360 days    Creatinine, Ser  Date Value Ref Range Status  01/02/2024 1.40 (H) 0.57 - 1.00 mg/dL Final         Passed - Valid encounter within last 6 months    Recent Outpatient Visits           1 month ago Moderate episode of recurrent major depressive disorder [facility])   [place] [facility] Melvin Pao, NP   5 months ago Acute gout of right foot, unspecified cause    [facility] Melvin Pao, NP

## 2024-02-20 ENCOUNTER — Telehealth: Payer: Self-pay

## 2024-02-20 NOTE — Telephone Encounter (Signed)
 Routing to provider to advise. Per last note, colchicine was discussed.

## 2024-02-20 NOTE — Telephone Encounter (Signed)
 Copied from CRM #[id number]. Topic: Clinical - Medical Advice >> Feb 20, 2024  1:08 PM Kevelyn M wrote: Reason for CRM: Patient is wanting gout medicine to prescribed per the conversation at last appointment. Not the alpurenol but the other one discussed and the meds to help her sleep. She's requesting to call this in to: [id number], [place] - [address] AT [facility] [address]  [address], [place] [address]

## 2024-02-21 MED ORDER — FEBUXOSTAT 40 MG PO TABS: 40.0000 mg | ORAL_TABLET | Freq: Every day | ORAL | 0 refills | Status: DC

## 2024-02-21 NOTE — Telephone Encounter (Signed)
 Febuxostat  was sent to the pharmacy for patient.

## 2024-02-21 NOTE — Telephone Encounter (Signed)
 Called and LVM letting patient know that the medication has been sent in for her.

## 2024-02-22 ENCOUNTER — Other Ambulatory Visit: Payer: Self-pay | Admitting: Nurse Practitioner

## 2024-02-22 NOTE — Telephone Encounter (Signed)
 Too soon for refill, LRF 01/02/24 for 30 and 3 RF.  Requested Prescriptions  Pending Prescriptions Disp Refills   traZODone  (DESYREL ) 50 MG tablet 30 tablet 3    Sig: Take 0.5-1 tablets (25-50 mg total) by mouth at bedtime as needed for sleep.     Psychiatry: Antidepressants - Serotonin Modulator Passed - 02/22/2024  3:31 PM      Passed - Completed PHQ-2 or PHQ-9 in the last 360 days      Passed - Valid encounter within last 6 months    Recent Outpatient Visits           1 month ago Moderate episode of recurrent major depressive disorder [facility])   [place] [facility] Melvin Pao, NP   5 months ago Acute gout of right foot, unspecified cause   [place] [facility] Melvin Pao, NP

## 2024-02-22 NOTE — Telephone Encounter (Signed)
 Copied from CRM [id number]. Topic: Clinical - Medication Refill >> Feb 22, 2024 10:56 AM Charlet HERO wrote: Medication: traZODone  (DESYREL ) 50 MG tablet  Has the patient contacted their pharmacy? Yes Needs a new script  This is the patient's preferred pharmacy:  [facility] #[id number] [facility] [place] - [address] AT [facility] [address] [place] [address] Phone: [phone] Fax: [phone]    Is this the correct pharmacy for this prescription? Yes If no, delete pharmacy and type the correct one.   Has the prescription been filled recently? Yes  Is the patient out of the medication? Yes  Has the patient been seen for an appointment in the last year OR does the patient have an upcoming appointment? Yes  Can we respond through MyChart? Yes  Agent: Please be advised that Rx refills may take up to 3 business days. We ask that you follow-up with your pharmacy.

## 2024-02-28 ENCOUNTER — Telehealth: Payer: Self-pay

## 2024-02-28 ENCOUNTER — Other Ambulatory Visit (HOSPITAL_COMMUNITY): Payer: Self-pay

## 2024-02-28 ENCOUNTER — Telehealth: Payer: Self-pay | Admitting: Pharmacy Technician

## 2024-02-28 NOTE — Telephone Encounter (Signed)
 Pharmacy Patient Advocate Encounter   Received notification from Onbase that prior authorization for Febuxostat  40MG  tablets  is required/requested.   Insurance verification completed.   The patient is insured through [employer] [facility] .   Per test claim: Refill too soon. PA is not needed at this time. Medication was filled 02/21/2024. Next eligible fill date is 03/15/2024.

## 2024-02-28 NOTE — Telephone Encounter (Unsigned)
 Copied from CRM [id number]. Topic: Clinical - Medication Refill >> Feb 22, 2024 10:56 AM Charlet HERO wrote: Medication: traZODone  (DESYREL ) 50 MG tablet  Has the patient contacted their pharmacy? Yes Needs a new script  This is the patient's preferred pharmacy:  [facility] #[id number] [facility], [place] - [address] AT [facility] [address] [place] [address] Phone: [phone] Fax: [phone]    Is this the correct pharmacy for this prescription? Yes If no, delete pharmacy and type the correct one.   Has the prescription been filled recently? Yes  Is the patient out of the medication? Yes  Has the patient been seen for an appointment in the last year OR does the patient have an upcoming appointment? Yes  Can we respond through MyChart? Yes  Agent: Please be advised that Rx refills may take up to 3 business days. We ask that you follow-up with your pharmacy. >> Feb 28, 2024 11:40 AM Montie POUR wrote: I got [facility] on the phone and they had 4 refills available for traZODone  (DESYREL ) 50 MG tablet. Loree will pick it up tomorrow. I wanted to route this to clinic to let them know because in [name]'s chart it states: Too soon for refill, LRF 01/02/24 for 30 and 3 RF. Thanks

## 2024-05-13 ENCOUNTER — Telehealth: Payer: Self-pay | Admitting: Nurse Practitioner

## 2024-05-13 ENCOUNTER — Other Ambulatory Visit: Payer: Self-pay

## 2024-05-13 MED ORDER — FEBUXOSTAT 40 MG PO TABS: 40.0000 mg | ORAL_TABLET | Freq: Every day | ORAL | 0 refills | Status: AC

## 2024-05-14 ENCOUNTER — Other Ambulatory Visit: Payer: Self-pay | Admitting: Nurse Practitioner

## 2024-05-15 NOTE — Telephone Encounter (Signed)
 error

## 2024-05-17 NOTE — Telephone Encounter (Signed)
 Requested Prescriptions  Pending Prescriptions Disp Refills   metoprolol  succinate (TOPROL -XL) 50 MG 24 hr tablet [Pharmacy Med Name: METOPROLOL  ER SUCCINATE 50MG  TABS] 90 tablet 0    Sig: TAKE 1 TABLET(50 MG) BY MOUTH DAILY WITH OR IMMEDIATELY FOLLOWING A MEAL     Cardiovascular:  Beta Blockers Passed - 05/17/2024 10:40 AM      Passed - Last BP in normal range    BP Readings from Last 1 Encounters:  01/02/24 131/68         Passed - Last Heart Rate in normal range    Pulse Readings from Last 1 Encounters:  01/02/24 66         Passed - Valid encounter within last 6 months    Recent Outpatient Visits           4 months ago Moderate episode of recurrent major depressive disorder [facility])   [place] [facility] Melvin Pao, NP   8 months ago Acute gout of right foot, unspecified cause   [place] [facility] Melvin Pao, NP

## 2024-06-27 ENCOUNTER — Other Ambulatory Visit: Payer: Self-pay | Admitting: Nurse Practitioner

## 2024-06-27 ENCOUNTER — Ambulatory Visit: Admitting: Nurse Practitioner

## 2024-06-27 ENCOUNTER — Encounter: Payer: Self-pay | Admitting: Nurse Practitioner

## 2024-06-27 VITALS — BP 109/73 | HR 65 | Temp 97.9°F | Ht 65.0 in | Wt 334.6 lb

## 2024-06-27 DIAGNOSIS — M79671 Pain in right foot: Secondary | ICD-10-CM

## 2024-06-27 NOTE — Progress Notes (Signed)
 "  BP 109/73 (BP Location: Left Arm, Patient Position: Sitting, Cuff Size: Large)   Pulse 65   Temp 97.9 F (36.6 C) (Oral)   Ht 5' 5 (1.651 m)   Wt (!) 334 lb 9.6 oz (151.8 kg)   SpO2 99%   BMI 55.68 kg/m    Subjective:    Patient ID: Breanna Rosario, female    DOB: Jul 04, 1975, 49 y.o.   MRN: [id number]  HPI: Breanna Rosario is a 49 y.o. female  Chief Complaint  Patient presents with   Foot Pain    Patient would like a referral to Ortho because she has a chip in the bone in her heel.   Patient presents to clinic due to heel pain.  Patient states she had an MRI done 15-18 years ago. There was no intervention done at that time.  States the pain is worse now and would like to see a specialist.  The pain puts her off her feet for days at a time.    She has not been able to take the Febuxostat  due to not being able to find it at pharmacies.  She plans to call around to see if any local pharmacies to see if they have it in stock.    Relevant past medical, surgical, family and social history reviewed and updated as indicated. Interim medical history since our last visit reviewed. Allergies and medications reviewed and updated.  Review of Systems  Musculoskeletal:        Right foot pain    Per HPI unless specifically indicated above     Objective:    BP 109/73 (BP Location: Left Arm, Patient Position: Sitting, Cuff Size: Large)   Pulse 65   Temp 97.9 F (36.6 C) (Oral)   Ht 5' 5 (1.651 m)   Wt (!) 334 lb 9.6 oz (151.8 kg)   SpO2 99%   BMI 55.68 kg/m   Wt Readings from Last 3 Encounters:  06/27/24 (!) 334 lb 9.6 oz (151.8 kg)  01/02/24 (!) 329 lb (149.2 kg)  09/13/23 (!) 334 lb 9.6 oz (151.8 kg)    Physical Exam Vitals and nursing note reviewed.  Constitutional:      General: She is not in acute distress.    Appearance: Normal appearance. She is obese. She is not ill-appearing, toxic-appearing or diaphoretic.  HENT:     Head: Normocephalic.     Right Ear:  External ear normal.     Left Ear: External ear normal.     Nose: Nose normal.     Mouth/Throat:     Mouth: Mucous membranes are moist.     Pharynx: Oropharynx is clear.  Eyes:     General:        Right eye: No discharge.        Left eye: No discharge.     Extraocular Movements: Extraocular movements intact.     Conjunctiva/sclera: Conjunctivae normal.     Pupils: Pupils are equal, round, and reactive to light.  Cardiovascular:     Rate and Rhythm: Normal rate and regular rhythm.     Heart sounds: No murmur heard. Pulmonary:     Effort: Pulmonary effort is normal. No respiratory distress.     Breath sounds: Normal breath sounds. No wheezing or rales.  Musculoskeletal:     Cervical back: Normal range of motion and neck supple.  Skin:    General: Skin is warm and dry.     Capillary Refill: Capillary refill takes  less than 2 seconds.  Neurological:     General: No focal deficit present.     Mental Status: She is alert and oriented to person, place, and time. Mental status is at baseline.  Psychiatric:        Mood and Affect: Mood normal.        Behavior: Behavior normal.        Thought Content: Thought content normal.        Judgment: Judgment normal.     Results for orders placed or performed in visit on 01/02/24  Comprehensive metabolic panel with GFR   Collection Time: 01/02/24  2:32 PM  Result Value Ref Range   Glucose 92 70 - 99 mg/dL   BUN 37 (H) 6 - 24 mg/dL   Creatinine, Ser 8.59 (H) 0.57 - 1.00 mg/dL   eGFR 46 (L) >40 fO/fpw/8.26   BUN/Creatinine Ratio 26 (H) 9 - 23   Sodium 140 134 - 144 mmol/L   Potassium 5.0 3.5 - 5.2 mmol/L   Chloride 107 (H) 96 - 106 mmol/L   CO2 16 (L) 20 - 29 mmol/L   Calcium  9.2 8.7 - 10.2 mg/dL   Total Protein 7.6 6.0 - 8.5 g/dL   Albumin 4.2 3.9 - 4.9 g/dL   Globulin, Total 3.4 1.5 - 4.5 g/dL   Bilirubin Total <9.7 0.0 - 1.2 mg/dL   Alkaline Phosphatase 100 44 - 121 IU/L   AST 24 0 - 40 IU/L   ALT 16 0 - 32 IU/L  Hemoglobin A1c    Collection Time: 01/02/24  2:32 PM  Result Value Ref Range   Hgb A1c MFr Bld 5.8 (H) 4.8 - 5.6 %   Est. average glucose Bld gHb Est-mCnc 120 mg/dL  Lipid panel   Collection Time: 01/02/24  2:32 PM  Result Value Ref Range   Cholesterol, Total 78 (L) 100 - 199 mg/dL   Triglycerides 818 (H) 0 - 149 mg/dL   HDL 23 (L) >60 mg/dL   VLDL Cholesterol Cal 30 5 - 40 mg/dL   LDL Chol Calc (NIH) 25 0 - 99 mg/dL   Chol/HDL Ratio 3.4 0.0 - 4.4 ratio  Uric acid   Collection Time: 01/02/24  2:32 PM  Result Value Ref Range   Uric Acid 10.6 (H) 2.6 - 6.2 mg/dL      Assessment & Plan:   Problem List Items Addressed This Visit   None Visit Diagnoses       Pain of right heel    -  Primary   Referral placed for podiatry for evaluation and management.   Relevant Orders   Ambulatory referral to Podiatry        Follow up plan: No follow-ups on file.      "

## 2024-06-28 NOTE — Telephone Encounter (Signed)
 Requested medication (s) are due for refill today: yes   Requested medication (s) are on the active medication list: yes   Last refill:  02/20/24 #90 0 refills  Future visit scheduled: no   Notes to clinic:  protocol failed last labs 06/29/23. Do you want to refill Rx?     Requested Prescriptions  Pending Prescriptions Disp Refills   clopidogrel  (PLAVIX ) 75 MG tablet [Pharmacy Med Name: CLOPIDOGREL  75MG  TABLETS] 30 tablet     Sig: TAKE 1 TABLET BY MOUTH DAILY WITH BREAKFAST     Hematology: Antiplatelets - clopidogrel  Failed - 06/28/2024 11:52 AM      Failed - HCT in normal range and within 180 days    Hematocrit  Date Value Ref Range Status  06/29/2023 36.6 34.0 - 46.6 % Final         Failed - HGB in normal range and within 180 days    Hemoglobin  Date Value Ref Range Status  06/29/2023 11.8 11.1 - 15.9 g/dL Final         Failed - PLT in normal range and within 180 days    Platelets  Date Value Ref Range Status  06/29/2023 187 150 - 450 x10E3/uL Final         Failed - Cr in normal range and within 360 days    Creatinine, Ser  Date Value Ref Range Status  01/02/2024 1.40 (H) 0.57 - 1.00 mg/dL Final         Passed - Valid encounter within last 6 months    Recent Outpatient Visits           Yesterday Pain of right heel   [facility] Melvin Pao, NP   5 months ago Moderate episode of recurrent major depressive disorder [facility])   [facility] Melvin Pao, NP   9 months ago Acute gout of right foot, unspecified cause    [facility] Melvin Pao, NP

## 2024-07-01 ENCOUNTER — Other Ambulatory Visit: Payer: Self-pay | Admitting: Nurse Practitioner

## 2024-07-02 NOTE — Telephone Encounter (Signed)
 Please call and schedule follow up appointment per provider.

## 2024-07-04 NOTE — Telephone Encounter (Signed)
 Called patient and left a message to call back to get scheduled for a follow up

## 2024-07-05 NOTE — Telephone Encounter (Signed)
 Called patient and left a message to call back to get scheduled.

## 2024-07-18 NOTE — Telephone Encounter (Signed)
 3rd attempt made to reach patient for scheduling. Called patient and left a message to call back to get scheduled.
